# Patient Record
Sex: Female | Born: 1980 | Race: White | Hispanic: No | Marital: Married | Smoking: Former smoker
Health system: Southern US, Community
[De-identification: ages and names within clinical notes are randomized; demographics above are authoritative.]

## PROBLEM LIST (undated history)

## (undated) ENCOUNTER — Inpatient Hospital Stay: Payer: Self-pay

## (undated) DIAGNOSIS — Z8619 Personal history of other infectious and parasitic diseases: Secondary | ICD-10-CM

## (undated) DIAGNOSIS — Z87440 Personal history of urinary (tract) infections: Secondary | ICD-10-CM

## (undated) DIAGNOSIS — J301 Allergic rhinitis due to pollen: Secondary | ICD-10-CM

## (undated) HISTORY — DX: Allergic rhinitis due to pollen: J30.1

## (undated) HISTORY — DX: Personal history of other infectious and parasitic diseases: Z86.19

## (undated) HISTORY — DX: Personal history of urinary (tract) infections: Z87.440

---

## 2002-01-17 DIAGNOSIS — O119 Pre-existing hypertension with pre-eclampsia, unspecified trimester: Secondary | ICD-10-CM

## 2004-09-07 HISTORY — PX: LEEP: SHX91

## 2008-09-18 ENCOUNTER — Ambulatory Visit: Payer: Self-pay

## 2009-04-20 ENCOUNTER — Emergency Department (HOSPITAL_COMMUNITY): Admission: EM | Admit: 2009-04-20 | Discharge: 2009-04-20 | Payer: Self-pay | Admitting: Emergency Medicine

## 2010-12-14 LAB — POCT I-STAT, CHEM 8
Hemoglobin: 13.6 g/dL (ref 12.0–15.0)
Sodium: 140 mEq/L (ref 135–145)
TCO2: 23 mmol/L (ref 0–100)

## 2012-06-01 ENCOUNTER — Emergency Department: Payer: Self-pay | Admitting: *Deleted

## 2012-06-01 LAB — CBC WITH DIFFERENTIAL/PLATELET
Basophil #: 0.1 10*3/uL (ref 0.0–0.1)
HCT: 38 % (ref 35.0–47.0)
Lymphocyte #: 4.8 10*3/uL — ABNORMAL HIGH (ref 1.0–3.6)
MCH: 27.9 pg (ref 26.0–34.0)
MCV: 83 fL (ref 80–100)
Monocyte #: 0.8 x10 3/mm (ref 0.2–0.9)
Monocyte %: 5.8 %
Neutrophil #: 7 10*3/uL — ABNORMAL HIGH (ref 1.4–6.5)
Platelet: 277 10*3/uL (ref 150–440)
RDW: 13.6 % (ref 11.5–14.5)
WBC: 13 10*3/uL — ABNORMAL HIGH (ref 3.6–11.0)

## 2012-06-01 LAB — PREGNANCY, URINE: Pregnancy Test, Urine: NEGATIVE m[IU]/mL

## 2012-06-01 LAB — URINALYSIS, COMPLETE
Bilirubin,UR: NEGATIVE
Glucose,UR: NEGATIVE mg/dL (ref 0–75)
Ketone: NEGATIVE
RBC,UR: 33926 /HPF (ref 0–5)
Squamous Epithelial: NONE SEEN
WBC UR: 263 /HPF (ref 0–5)

## 2012-06-01 LAB — BASIC METABOLIC PANEL
Calcium, Total: 9.4 mg/dL (ref 8.5–10.1)
Chloride: 105 mmol/L (ref 98–107)
Co2: 23 mmol/L (ref 21–32)
EGFR (African American): >60
Potassium: 3.6 mmol/L (ref 3.5–5.1)
Sodium: 139 mmol/L (ref 136–145)

## 2013-11-16 ENCOUNTER — Ambulatory Visit: Payer: Self-pay | Admitting: Internal Medicine

## 2015-04-17 ENCOUNTER — Ambulatory Visit (INDEPENDENT_AMBULATORY_CARE_PROVIDER_SITE_OTHER): Payer: 59 | Admitting: Primary Care

## 2015-04-17 ENCOUNTER — Encounter: Payer: Self-pay | Admitting: Primary Care

## 2015-04-17 ENCOUNTER — Encounter (INDEPENDENT_AMBULATORY_CARE_PROVIDER_SITE_OTHER): Payer: Self-pay

## 2015-04-17 VITALS — BP 122/84 | HR 76 | Temp 98.2°F | Ht 65.0 in | Wt 181.0 lb

## 2015-04-17 DIAGNOSIS — F42 Obsessive-compulsive disorder: Secondary | ICD-10-CM | POA: Diagnosis not present

## 2015-04-17 DIAGNOSIS — F429 Obsessive-compulsive disorder, unspecified: Secondary | ICD-10-CM | POA: Insufficient documentation

## 2015-04-17 NOTE — Assessment & Plan Note (Signed)
Diagnosed 5-6 years ago, managed on Zoloft 50 mg by [facility]. Feels well managed on this dose. Continue same.

## 2015-04-17 NOTE — Progress Notes (Signed)
Subjective:    Patient ID: Julia Gay, female    DOB: Jun 26, 1981, 34 y.o.   MRN: [id number]  HPI  Julia Gay is a 34 year old female who presents today to establish care and discuss the problems mentioned below. Will obtain old records.  1) Obsessive compulsive disorder: Managed on Zoloft 50 mg tablets and has been taking for 5-6 years. She was once up to 200 mg and feels well managed on 50 mg.. She is managed at [facility].   2) Rectal Bleeding: One episode of rectal bleeding immediately after having a bowel movement 5 days ago. The blood was bright red and she found it on the toilet paper. Denies pain, further bleeding, itching. She has 1 bowel movement weekly which is normal for her.   Review of Systems  Constitutional: Negative for unexpected weight change.  HENT: Negative for rhinorrhea.   Respiratory: Negative for cough and shortness of breath.   Cardiovascular: Negative for chest pain.  Gastrointestinal: Negative for diarrhea and constipation.  Genitourinary: Negative for difficulty urinating.       Regular periods  Musculoskeletal: Negative for myalgias and arthralgias.  Skin: Negative for rash.  Allergic/Immunologic: Positive for environmental allergies.  Neurological: Negative for dizziness, numbness and headaches.  Psychiatric/Behavioral:       Denies concerns for anxiety or depression       Past Medical History  Diagnosis Date  . Hay fever   . History of UTI   . History of HPV infection     Social History   Social History  . Marital Status: Married    Spouse Name: N/A  . Number of Children: N/A  . Years of Education: N/A   Occupational History  . Not on file.   Social History Main Topics  . Smoking status: Former Research scientist (life sciences)  . Smokeless tobacco: Never Used     Comment: quit in 2004  . Alcohol Use: 0.0 oz/week    0 Standard drinks or equivalent per week     Comment: socially  . Drug Use: No  . Sexual Activity: Not on file   Other Topics  Concern  . Not on file   Social History Narrative   Married.    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   Has 1 child.   Enjoys playing soccer and tennis, singing, watching TV.       Past Surgical History  Procedure Laterality Date  . Leep  2006    Family History  Problem Relation Age of Onset  . Alcohol abuse Father     quit when pt was a child  . Alcohol abuse Other     both grandparents  . Heart disease Father   . Stroke Father   . Hypertension Father   . Anemia Mother   . Aplastic anemia Father     No Known Allergies  No current outpatient prescriptions on file prior to visit.   No current facility-administered medications on file prior to visit.    BP 122/84 mmHg  Pulse 76  Temp(Src) 98.2 F (36.8 C) (Oral)  Ht 5\' 5"  (1.651 m)  Wt 181 lb (82.101 kg)  BMI 30.12 kg/m2  SpO2 99%  LMP 03/28/2015    Objective:   Physical Exam  Constitutional: She appears well-nourished.  Cardiovascular: Normal rate and regular rhythm.   Pulmonary/Chest: Effort normal and breath sounds normal.  Abdominal: Soft. Bowel sounds are normal.  No hemorrhoids noted on exam. Occult stool card negative today.  Skin: Skin is warm and dry.  Psychiatric: She has a normal mood and affect.          Assessment & Plan:

## 2015-04-17 NOTE — Patient Instructions (Signed)
Please schedule a physical with me in the next month at your convienence. You will also schedule a lab only appointment one week prior. We will discuss your lab results during your physical.  We will complete your pap and breast exam during that visit as well.  It was a pleasure to meet you today! Please don't hesitate to call me with any questions. Welcome to [facility]!

## 2015-04-17 NOTE — Progress Notes (Signed)
Pre visit review using our clinic review tool, if applicable. No additional management support is needed unless otherwise documented below in the visit note. 

## 2015-04-29 ENCOUNTER — Ambulatory Visit: Payer: Self-pay | Admitting: Family Medicine

## 2015-05-07 ENCOUNTER — Other Ambulatory Visit: Payer: Self-pay | Admitting: Primary Care

## 2015-05-07 DIAGNOSIS — Z0000 Encounter for general adult medical examination without abnormal findings: Secondary | ICD-10-CM

## 2015-05-14 ENCOUNTER — Other Ambulatory Visit (INDEPENDENT_AMBULATORY_CARE_PROVIDER_SITE_OTHER): Payer: 59

## 2015-05-14 DIAGNOSIS — Z0000 Encounter for general adult medical examination without abnormal findings: Secondary | ICD-10-CM | POA: Diagnosis not present

## 2015-05-14 LAB — COMPREHENSIVE METABOLIC PANEL
ALT: 14 U/L (ref 0–35)
AST: 16 U/L (ref 0–37)
Albumin: 4.5 g/dL (ref 3.5–5.2)
Alkaline Phosphatase: 45 U/L (ref 39–117)
BILIRUBIN TOTAL: 0.4 mg/dL (ref 0.2–1.2)
BUN: 10 mg/dL (ref 6–23)
CALCIUM: 9.5 mg/dL (ref 8.4–10.5)
CHLORIDE: 102 meq/L (ref 96–112)
CO2: 26 meq/L (ref 19–32)
Creatinine, Ser: 0.6 mg/dL (ref 0.40–1.20)
GFR: 120.87 mL/min (ref >60.00)
GLUCOSE: 85 mg/dL (ref 70–99)
POTASSIUM: 4.1 meq/L (ref 3.5–5.1)
Sodium: 136 mEq/L (ref 135–145)
Total Protein: 7.1 g/dL (ref 6.0–8.3)

## 2015-05-14 LAB — LIPID PANEL
CHOL/HDL RATIO: 4
CHOLESTEROL: 168 mg/dL (ref 0–200)
HDL: 43.2 mg/dL (ref >39.00)
LDL CALC: 110 mg/dL — AB (ref 0–99)
NonHDL: 124.5
TRIGLYCERIDES: 74 mg/dL (ref 0.0–149.0)
VLDL: 14.8 mg/dL (ref 0.0–40.0)

## 2015-05-14 LAB — CBC
HEMATOCRIT: 36.6 % (ref 36.0–46.0)
HEMOGLOBIN: 11.7 g/dL — AB (ref 12.0–15.0)
MCHC: 32 g/dL (ref 30.0–36.0)
MCV: 84.8 fl (ref 78.0–100.0)
PLATELETS: 263 10*3/uL (ref 150.0–400.0)
RBC: 4.31 Mil/uL (ref 3.87–5.11)
RDW: 13.3 % (ref 11.5–15.5)
WBC: 8.7 10*3/uL (ref 4.0–10.5)

## 2015-05-14 LAB — TSH: TSH: 1.67 u[IU]/mL (ref 0.35–4.50)

## 2015-05-14 LAB — HEMOGLOBIN A1C: Hgb A1c MFr Bld: 5 % (ref 4.6–6.5)

## 2015-05-15 ENCOUNTER — Encounter: Payer: Self-pay | Admitting: Obstetrics and Gynecology

## 2015-05-15 ENCOUNTER — Other Ambulatory Visit: Payer: Self-pay | Admitting: *Deleted

## 2015-05-15 ENCOUNTER — Ambulatory Visit (INDEPENDENT_AMBULATORY_CARE_PROVIDER_SITE_OTHER): Payer: 59 | Admitting: Obstetrics and Gynecology

## 2015-05-15 ENCOUNTER — Other Ambulatory Visit: Payer: 59

## 2015-05-15 VITALS — BP 118/69 | HR 91 | Ht 64.0 in | Wt 182.7 lb

## 2015-05-15 DIAGNOSIS — N926 Irregular menstruation, unspecified: Secondary | ICD-10-CM

## 2015-05-15 LAB — POCT URINE PREGNANCY: PREG TEST UR: POSITIVE — AB

## 2015-05-15 NOTE — Progress Notes (Signed)
Patient ID: Julia Gay, female   DOB: 1980/10/28, 34 y.o.   MRN: [id number]  Here for pregnancy confimation with LMP 03/28/15, EGA [redacted]w[redacted]d and [facility] 01/02/16  Ultrasound today reveals: Indications:Dating, Viability, HX miscarriage Findings:  Singleton intrauterine pregnancy is visualized with a CRL consistent with 7 1/[redacted] weeks gestation, giving an (U/S) EDD of 12/31/2015. The (U/S) EDD is consistent with the clinically established (LMP) EDD of 01/03/2016.  FHR: 136 CRL measurement: 10.6 mm Yolk sac and and early anatomy is normal.  Right Ovary measures 3.0 x 2.1 x 2.2 cm. It is normal in appearance. Left Ovary measures 1.9 x 1.7 x 1.8 cm. It is normal appearance. There is evidence of a corpus luteal cyst in the Right Survey of the adnexa demonstrates no adnexal masses. There is no free peritoneal fluid in the cul de sac.  Impression: 1. 7 1/7 week Viable Singleton Intrauterine pregnancy by U/S. 2. (U/S) EDD is consistent with Clinically established (LMP) EDD of 01/03/2016.  Recommendations: 1.Clinical correlation with the patient's History and Physical Exam.   Leane Para, Rad Tech  Scan reviewed and agree with findings, reviewed with patient.  I:1: nausea in early pregnancy 2: IUP 7w 1d EGA 3: AMA 4: anemia- on iron and B12  P: Diclegis and PNV samples given Nurse OB intake & labs in 2 weeks and NOB PE in 4 weeks Catalino Plascencia Trudee Kuster, CNM

## 2015-05-21 ENCOUNTER — Encounter: Payer: 59 | Admitting: Primary Care

## 2015-05-21 ENCOUNTER — Other Ambulatory Visit (HOSPITAL_COMMUNITY)
Admission: RE | Admit: 2015-05-21 | Discharge: 2015-05-21 | Disposition: A | Discharge status: Home / Self Care | Payer: 59 | Source: Ambulatory Visit | Attending: Primary Care | Admitting: Primary Care

## 2015-05-21 ENCOUNTER — Encounter: Payer: Self-pay | Admitting: Primary Care

## 2015-05-21 ENCOUNTER — Ambulatory Visit (INDEPENDENT_AMBULATORY_CARE_PROVIDER_SITE_OTHER): Payer: 59 | Admitting: Primary Care

## 2015-05-21 VITALS — BP 118/72 | HR 84 | Temp 97.8°F | Ht 64.0 in | Wt 181.0 lb

## 2015-05-21 DIAGNOSIS — Z01411 Encounter for gynecological examination (general) (routine) with abnormal findings: Secondary | ICD-10-CM | POA: Insufficient documentation

## 2015-05-21 DIAGNOSIS — Z0000 Encounter for general adult medical examination without abnormal findings: Secondary | ICD-10-CM | POA: Diagnosis not present

## 2015-05-21 DIAGNOSIS — Z1151 Encounter for screening for human papillomavirus (HPV): Secondary | ICD-10-CM | POA: Diagnosis not present

## 2015-05-21 NOTE — Assessment & Plan Note (Signed)
Tetanus UTD. Pap preformed today. She is [redacted] weeks pregnant and is following with [facility]. Discussed the importance of healthy diet and exercise to maintain a healthy weight. She will work to reduce fast food, fried foods and sugar. Labs unremarkable, exam unremarkable. Follow up in 1 year for repeat physical.

## 2015-05-21 NOTE — Progress Notes (Signed)
Pre visit review using our clinic review tool, if applicable. No additional management support is needed unless otherwise documented below in the visit note. 

## 2015-05-21 NOTE — Patient Instructions (Addendum)
We will notify you of your pap results.  Work to [employer] by limiting fast food, fried foods, and sugar. Start exercising regularly as allowed by OB/GYN.  Follow up in 1 year for repeat physical or sooner if needed.  It was a pleasure to see you today!

## 2015-05-21 NOTE — Progress Notes (Signed)
Subjective:    Patient ID: Julia Gay, female    DOB: Feb 21, 1981, 34 y.o.   MRN: [id number]  HPI  Julia Gay is a 34 year old female who presents today for complete physical.  Immunizations: -Tetanus: Completed within 10 years.  -Influenza: Does not complete.    Diet: Endorses a poor diet. She is a vegetarian. Breakfast: Fast food.  Lunch: Fast food.  Dinner: Pasta, salads, vegetables. Beverages: Water, sodas Desserts: Cookies, cupcakes. Once twice weekly.  Exercise: She is not exercising, but is active with work.  Eye exam: Completed 2 years ago. Denies changes in vision. Dental exam: Completed cleaning several months ago. Pap Smear: Completed 1.5 years ago. Would like one today.    Review of Systems  Constitutional: Negative for unexpected weight change.  HENT: Negative for rhinorrhea.   Respiratory: Negative for cough and shortness of breath.   Cardiovascular: Negative for chest pain.  Gastrointestinal: Negative for diarrhea and constipation.  Genitourinary: Negative for difficulty urinating.       [redacted] weeks pregnant  Musculoskeletal: Negative for myalgias and arthralgias.  Skin: Negative for rash.  Neurological: Negative for dizziness, numbness and headaches.  Psychiatric/Behavioral:       Denies concerns for anxiety or depression.       Past Medical History  Diagnosis Date  . Hay fever   . History of UTI   . History of HPV infection     Social History   Social History  . Marital Status: Married    Spouse Name: N/A  . Number of Children: N/A  . Years of Education: N/A   Occupational History  . Not on file.   Social History Main Topics  . Smoking status: Former Research scientist (life sciences)  . Smokeless tobacco: Never Used     Comment: quit in 2004  . Alcohol Use: 0.0 oz/week    0 Standard drinks or equivalent per week     Comment: socially  . Drug Use: No  . Sexual Activity: Yes    Birth Control/ Protection: None   Other Topics Concern  . Not on file   Social  History Narrative   Married.    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   Has 1 child.   Enjoys playing soccer and tennis, singing, watching TV.       Past Surgical History  Procedure Laterality Date  . Leep  2006    Family History  Problem Relation Age of Onset  . Alcohol abuse Father     quit when pt was a child  . Alcohol abuse Other     both grandparents  . Heart disease Father   . Stroke Father   . Hypertension Father   . Anemia Mother   . Aplastic anemia Father     No Known Allergies  Current Outpatient Prescriptions on File Prior to Visit  Medication Sig Dispense Refill  . metroNIDAZOLE (METROCREAM) 0.75 % cream Apply topically 2 (two) times daily.    . sertraline (ZOLOFT) 50 MG tablet Take 1 tablet by mouth daily.     No current facility-administered medications on file prior to visit.    BP 118/72 mmHg  Pulse 84  Temp(Src) 97.8 F (36.6 C) (Oral)  Ht 5\' 4"  (1.626 m)  Wt 181 lb (82.101 kg)  BMI 31.05 kg/m2  SpO2 98%  LMP 03/29/2015    Objective:   Physical Exam  Constitutional: She is oriented to person, place, and time. She appears well-nourished.  HENT:  Right Ear: Tympanic membrane and ear canal normal.  Left Ear: Tympanic membrane and ear canal normal.  Nose: Nose normal.  Mouth/Throat: Oropharynx is clear and moist.  Eyes: Conjunctivae and EOM are normal. Pupils are equal, round, and reactive to light.  Neck: Neck supple. No thyromegaly present.  Cardiovascular: Normal rate and regular rhythm.   Pulmonary/Chest: Effort normal and breath sounds normal. Right breast exhibits no mass and no tenderness. Left breast exhibits no mass and no tenderness.  Abdominal: Soft. Bowel sounds are normal. There is no tenderness.  Genitourinary: Vagina normal. No breast swelling or tenderness. Cervix exhibits no motion tenderness and no discharge. Right adnexum displays no tenderness. Left adnexum displays no tenderness.  Musculoskeletal: Normal  range of motion.  Lymphadenopathy:    She has no cervical adenopathy.  Neurological: She is alert and oriented to person, place, and time. She has normal reflexes. No cranial nerve deficit.  Skin: Skin is warm and dry.  Psychiatric: She has a normal mood and affect.          Assessment & Plan:

## 2015-05-23 LAB — CYTOLOGY - PAP

## 2015-05-30 ENCOUNTER — Ambulatory Visit (INDEPENDENT_AMBULATORY_CARE_PROVIDER_SITE_OTHER): Payer: 59 | Admitting: Obstetrics and Gynecology

## 2015-05-30 VITALS — BP 111/76 | HR 80 | Wt 184.0 lb

## 2015-05-30 DIAGNOSIS — R638 Other symptoms and signs concerning food and fluid intake: Secondary | ICD-10-CM

## 2015-05-30 DIAGNOSIS — Z1389 Encounter for screening for other disorder: Secondary | ICD-10-CM

## 2015-05-30 DIAGNOSIS — T7589XA Other specified effects of external causes, initial encounter: Secondary | ICD-10-CM

## 2015-05-30 DIAGNOSIS — Z331 Pregnant state, incidental: Secondary | ICD-10-CM

## 2015-05-30 DIAGNOSIS — Z113 Encounter for screening for infections with a predominantly sexual mode of transmission: Secondary | ICD-10-CM

## 2015-05-30 NOTE — Patient Instructions (Signed)

## 2015-05-30 NOTE — Progress Notes (Signed)
ZIKA EXPOSURE SCREEN:  The patient has not traveled to a [place] Virus endemic area within the past 6 months, nor has she had unprotected sex with a partner who has travelled to a [place] endemic region within the past 6 months. The patient has been advised to notify us if these factors change any time during this current pregnancy, so adequate testing and monitoring can be initiated.  Julia Gay for NOB nurse interview visit. G-3.   P-1011. Pregnancy eduction material explained and given. No cats in the home. 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 She does not clean litter box but does sweep litter off floor. Pt would like to have this done. NOB labs ordered. TSH/HbgA1c done on 05/14/2015 and was not repeated. HIV labs and Drug screen were explained optional and she could opt out of tests but did not decline. Drug screen ordered  and sent to lab. PNV encouraged. NT to discuss with provider. Pt. To follow up with provider in 1-2 weeks for NOB physical.  All questions answered.

## 2015-05-31 ENCOUNTER — Other Ambulatory Visit: Payer: Self-pay | Admitting: Obstetrics and Gynecology

## 2015-05-31 DIAGNOSIS — O09899 Supervision of other high risk pregnancies, unspecified trimester: Secondary | ICD-10-CM | POA: Insufficient documentation

## 2015-05-31 DIAGNOSIS — O9989 Other specified diseases and conditions complicating pregnancy, childbirth and the puerperium: Principal | ICD-10-CM

## 2015-05-31 DIAGNOSIS — Z2839 Other underimmunization status: Secondary | ICD-10-CM

## 2015-05-31 DIAGNOSIS — Z283 Underimmunization status: Secondary | ICD-10-CM | POA: Insufficient documentation

## 2015-05-31 LAB — CBC WITH DIFFERENTIAL/PLATELET
BASOS: 0 %
Basophils Absolute: 0 10*3/uL (ref 0.0–0.2)
EOS (ABSOLUTE): 0.1 10*3/uL (ref 0.0–0.4)
EOS: 2 %
HEMATOCRIT: 36.5 % (ref 34.0–46.6)
Hemoglobin: 11.9 g/dL (ref 11.1–15.9)
IMMATURE GRANS (ABS): 0 10*3/uL (ref 0.0–0.1)
IMMATURE GRANULOCYTES: 0 %
LYMPHS: 23 %
Lymphocytes Absolute: 1.9 10*3/uL (ref 0.7–3.1)
MCH: 27.4 pg (ref 26.6–33.0)
MCHC: 32.6 g/dL (ref 31.5–35.7)
MCV: 84 fL (ref 79–97)
MONOS ABS: 0.5 10*3/uL (ref 0.1–0.9)
Monocytes: 6 %
NEUTROS PCT: 69 %
Neutrophils Absolute: 5.6 10*3/uL (ref 1.4–7.0)
PLATELETS: 295 10*3/uL (ref 150–379)
RBC: 4.34 x10E6/uL (ref 3.77–5.28)
RDW: 13.2 % (ref 12.3–15.4)
WBC: 8.1 10*3/uL (ref 3.4–10.8)

## 2015-05-31 LAB — TOXOPLASMA ANTIBODIES- IGG AND  IGM
Toxoplasma Antibody- IgM: 7.3 AU/mL (ref 0.0–7.9)
Toxoplasma IgG Ratio: <3 IU/mL (ref 0.0–7.1)

## 2015-05-31 LAB — URINALYSIS, ROUTINE W REFLEX MICROSCOPIC
BILIRUBIN UA: NEGATIVE
Glucose, UA: NEGATIVE
Ketones, UA: NEGATIVE
Leukocytes, UA: NEGATIVE
Nitrite, UA: NEGATIVE
PH UA: 6.5 (ref 5.0–7.5)
Protein, UA: NEGATIVE
RBC UA: NEGATIVE
Specific Gravity, UA: 1.02 (ref 1.005–1.030)
UUROB: 0.2 mg/dL (ref 0.2–1.0)

## 2015-05-31 LAB — HIV ANTIBODY (ROUTINE TESTING W REFLEX): HIV SCREEN 4TH GENERATION: NONREACTIVE

## 2015-05-31 LAB — VARICELLA ZOSTER ANTIBODY, IGM

## 2015-05-31 LAB — RH TYPE: RH TYPE: POSITIVE

## 2015-05-31 LAB — ABO

## 2015-05-31 LAB — RUBELLA ANTIBODY, IGM: Rubella IgM: <20 AU/mL (ref 0.0–19.9)

## 2015-05-31 LAB — HEPATITIS B SURFACE ANTIGEN: Hepatitis B Surface Ag: NEGATIVE

## 2015-05-31 LAB — ANTIBODY SCREEN: Antibody Screen: NEGATIVE

## 2015-05-31 LAB — RPR: RPR: NONREACTIVE

## 2015-06-01 LAB — GC/CHLAMYDIA PROBE AMP
CHLAMYDIA, DNA PROBE: NEGATIVE
Neisseria gonorrhoeae by PCR: NEGATIVE

## 2015-06-01 LAB — URINE CULTURE

## 2015-06-10 ENCOUNTER — Encounter: Payer: Self-pay | Admitting: Obstetrics and Gynecology

## 2015-06-12 ENCOUNTER — Encounter: Payer: 59 | Admitting: Obstetrics and Gynecology

## 2015-06-13 ENCOUNTER — Ambulatory Visit (INDEPENDENT_AMBULATORY_CARE_PROVIDER_SITE_OTHER): Payer: 59 | Admitting: Obstetrics and Gynecology

## 2015-06-13 ENCOUNTER — Encounter: Payer: Self-pay | Admitting: Obstetrics and Gynecology

## 2015-06-13 VITALS — BP 110/68 | HR 80 | Wt 183.5 lb

## 2015-06-13 DIAGNOSIS — O09521 Supervision of elderly multigravida, first trimester: Secondary | ICD-10-CM

## 2015-06-13 DIAGNOSIS — Z3491 Encounter for supervision of normal pregnancy, unspecified, first trimester: Secondary | ICD-10-CM

## 2015-06-13 DIAGNOSIS — E669 Obesity, unspecified: Secondary | ICD-10-CM

## 2015-06-13 DIAGNOSIS — O99210 Obesity complicating pregnancy, unspecified trimester: Secondary | ICD-10-CM | POA: Insufficient documentation

## 2015-06-13 LAB — POCT URINALYSIS DIPSTICK
BILIRUBIN UA: NEGATIVE
Blood, UA: NEGATIVE
GLUCOSE UA: NEGATIVE
KETONES UA: NEGATIVE
LEUKOCYTES UA: NEGATIVE
NITRITE UA: NEGATIVE
Protein, UA: NEGATIVE
Spec Grav, UA: 1.01
Urobilinogen, UA: 0.2
pH, UA: 7

## 2015-06-13 NOTE — Progress Notes (Signed)
NOB physical, pt is feeling well

## 2015-06-13 NOTE — Patient Instructions (Signed)

## 2015-06-13 NOTE — Progress Notes (Signed)
NEW OB HISTORY AND PHYSICAL  SUBJECTIVE:       Julia Gay is a 34 y.o. G70P1010 female, Patient's last menstrual period was 03/29/2015 (exact date)., Estimated Date of Delivery: 01/03/16, [redacted]w[redacted]d, presents today for establishment of Prenatal Care. She has no unusual complaints and complains of improved nausea      Gynecologic History Patient's last menstrual period was 03/29/2015 (exact date). Normal Contraception: none Last Pap: 05/2015. Results were: normal  Obstetric History OB History  Gravida Para Term Preterm AB SAB TAB Ectopic Multiple Living  3 1 1  1 1         # Outcome Date GA Lbr Len/2nd Weight Sex Delivery Anes PTL Lv  3 Current           2 SAB 12/2014        FD  1 Term 01/17/02 [redacted]w[redacted]d  7 lb 6 oz (3.345 kg) M Vag-Spont  N      Complications: Preeclampsia complicating hypertension      Past Medical History  Diagnosis Date  . Hay fever   . History of UTI   . History of HPV infection     Past Surgical History  Procedure Laterality Date  . Leep  2006    Current Outpatient Prescriptions on File Prior to Visit  Medication Sig Dispense Refill  . ferrous fumarate (HEMOCYTE - 106 MG FE) 325 (106 FE) MG TABS tablet Take 1 tablet by mouth daily.    . metroNIDAZOLE (METROCREAM) 0.75 % cream Apply topically 2 (two) times daily.    . Prenatal Vit-Fe Fumarate-FA (PRENATAL MULTIVITAMIN) TABS tablet Take 1 tablet by mouth daily at 12 noon.    . sertraline (ZOLOFT) 50 MG tablet Take 1 tablet by mouth daily.    . vitamin B-12 (CYANOCOBALAMIN) 100 MCG tablet Take 100 mcg by mouth daily.     No current facility-administered medications on file prior to visit.    No Known Allergies  Social History   Social History  . Marital Status: Married    Spouse Name: N/A  . Number of Children: N/A  . Years of Education: N/A   Occupational History  . Not on file.   Social History Main Topics  . Smoking status: Former Research scientist (life sciences)  . Smokeless tobacco: Never Used     Comment: quit  in 2004  . Alcohol Use: 0.0 oz/week    0 Standard drinks or equivalent per week     Comment: socially  . Drug Use: No  . Sexual Activity: Yes    Birth Control/ Protection: None   Other Topics Concern  . Not on file   Social History Narrative   Married.    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   Has 1 child.   Enjoys playing soccer and tennis, singing, watching TV.       Family History  Problem Relation Age of Onset  . Alcohol abuse Father     quit when pt was a child  . Alcohol abuse Other     both grandparents  . Heart disease Father   . Stroke Father   . Hypertension Father   . Anemia Mother   . Aplastic anemia Father     The following portions of the patient's history were reviewed and updated as appropriate: allergies, current medications, past OB history, past medical history, past surgical history, past family history, past social history, and problem list.    OBJECTIVE: Initial Physical Exam (New OB)  GENERAL APPEARANCE: alert,  well appearing, in no apparent distress, oriented to person, place and time, overweight, well hydrated HEAD: normocephalic, atraumatic MOUTH: mucous membranes moist, pharynx normal without lesions THYROID: no thyromegaly or masses present BREASTS: no masses noted, no significant tenderness, no palpable axillary nodes, no skin changes LUNGS: clear to auscultation, no wheezes, rales or rhonchi, symmetric air entry HEART: regular rate and rhythm, no murmurs ABDOMEN: soft, nontender, nondistended, no abnormal masses, no epigastric pain, obese, fundus not palpable and FHT present EXTREMITIES: no redness or tenderness in the calves or thighs, no edema SKIN: normal coloration and turgor, no rashes LYMPH NODES: no adenopathy palpable NEUROLOGIC: alert, oriented, normal speech, no focal findings or movement disorder noted  PELVIC EXAM normal   ASSESSMENT: Normal pregnancy, BMI 30, AMA  PLAN:  [facility]  obtained Prenatal care See orders

## 2015-06-20 ENCOUNTER — Encounter: Payer: Self-pay | Admitting: Obstetrics and Gynecology

## 2015-06-20 ENCOUNTER — Other Ambulatory Visit: Payer: Self-pay | Admitting: Obstetrics and Gynecology

## 2015-06-27 ENCOUNTER — Encounter: Payer: Self-pay | Admitting: Obstetrics and Gynecology

## 2015-07-02 ENCOUNTER — Encounter: Payer: Self-pay | Admitting: Internal Medicine

## 2015-07-02 ENCOUNTER — Ambulatory Visit (INDEPENDENT_AMBULATORY_CARE_PROVIDER_SITE_OTHER): Payer: 59 | Admitting: Internal Medicine

## 2015-07-02 VITALS — BP 110/66 | HR 90 | Temp 97.9°F | Wt 183.0 lb

## 2015-07-02 DIAGNOSIS — R002 Palpitations: Secondary | ICD-10-CM

## 2015-07-02 DIAGNOSIS — N390 Urinary tract infection, site not specified: Secondary | ICD-10-CM | POA: Diagnosis not present

## 2015-07-02 LAB — POCT URINALYSIS DIPSTICK
BILIRUBIN UA: NEGATIVE
GLUCOSE UA: NEGATIVE
KETONES UA: NEGATIVE
Nitrite, UA: NEGATIVE
PH UA: 5.5
RBC UA: NEGATIVE
Spec Grav, UA: >=1.03
Urobilinogen, UA: NEGATIVE

## 2015-07-02 MED ORDER — AMOXICILLIN-POT CLAVULANATE 875-125 MG PO TABS: 1.0000 | ORAL_TABLET | Freq: Two times a day (BID) | ORAL | Status: DC

## 2015-07-02 NOTE — Progress Notes (Signed)
HPI  Pt presents to the clinic today with c/o plapitations. She reports she awoke this afternoon and felt the palpitations. She denies chest pain, chest tightness or dizziness. She did not consume any caffeine or other stimulants prior or after her nap. She reports she has not taken her Zoloft 2 days prior, but she did take it this morning. Last time she had these symptoms she had a UTI. She denies dysuria, urgency, frequency or blood in her urine. She denies fever, chills or body aches. She has not taken anything OTC.   Review of Systems  Past Medical History  Diagnosis Date  . Hay fever   . History of UTI   . History of HPV infection     Family History  Problem Relation Age of Onset  . Alcohol abuse Father     quit when pt was a child  . Alcohol abuse Other     both grandparents  . Heart disease Father   . Stroke Father   . Hypertension Father   . Anemia Mother   . Aplastic anemia Father     Social History   Social History  . Marital Status: Married    Spouse Name: N/A  . Number of Children: N/A  . Years of Education: N/A   Occupational History  . Not on file.   Social History Main Topics  . Smoking status: Former Research scientist (life sciences)  . Smokeless tobacco: Never Used     Comment: quit in 2004  . Alcohol Use: 0.0 oz/week    0 Standard drinks or equivalent per week     Comment: socially  . Drug Use: No  . Sexual Activity: Yes    Birth Control/ Protection: None   Other Topics Concern  . Not on file   Social History Narrative   Married.    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   Has 1 child.   Enjoys playing soccer and tennis, singing, watching TV.       No Known Allergies  Constitutional: Denies fever, malaise, fatigue, headache or abrupt weight changes.   Cardiovascular: Pt reports palpitations. Denies chest pain, chest tightness. GU: Denies urgency, frequency, dysuria, burning sensation, blood in urine, odor or discharge. Skin: Denies redness, rashes,  lesions or ulcercations.   No other specific complaints in a complete review of systems (except as listed in HPI above).    Objective:   Physical Exam  BP 110/66 mmHg  Pulse 90  Temp(Src) 97.9 F (36.6 C) (Oral)  Wt 183 lb (83.008 kg)  SpO2 99%  LMP 03/29/2015 (Exact Date)  Wt Readings from Last 3 Encounters:  07/02/15 183 lb (83.008 kg)  06/13/15 183 lb 8 oz (83.235 kg)  05/30/15 184 lb (83.462 kg)    General: Appears her stated age, well developed, well nourished in NAD. Cardiovascular: Normal rate and rhythm. S1,S2 noted. No murmur noted.   Pulmonary/Chest: Normal effort and positive vesicular breath sounds. No respiratory distress. No wheezes, rales or ronchi noted.  Abdomen: Soft and nontender. Normal bowel sounds. No distention or masses noted.  No CVA tenderness.      Assessment & Plan:   Palpitations   Urinalysis: 2+ leuks Will send urine culture eRx sent if for augmentin BID x 7 days (she is pregnant) Drink plenty of fluids She has had a multitude of labs recently- no need to repeat at this time  RTC as needed or if symptoms persist.

## 2015-07-02 NOTE — Progress Notes (Signed)
Pre visit review using our clinic review tool, if applicable. No additional management support is needed unless otherwise documented below in the visit note. 

## 2015-07-03 NOTE — Patient Instructions (Signed)

## 2015-07-04 ENCOUNTER — Encounter: Payer: Self-pay | Admitting: Primary Care

## 2015-07-04 ENCOUNTER — Ambulatory Visit (INDEPENDENT_AMBULATORY_CARE_PROVIDER_SITE_OTHER): Payer: 59 | Admitting: Primary Care

## 2015-07-04 VITALS — BP 110/68 | HR 81 | Temp 98.1°F | Ht 64.0 in | Wt 185.0 lb

## 2015-07-04 DIAGNOSIS — R002 Palpitations: Secondary | ICD-10-CM

## 2015-07-04 LAB — URINE CULTURE
COLONY COUNT: NO GROWTH
Organism ID, Bacteria: NO GROWTH

## 2015-07-04 NOTE — Assessment & Plan Note (Signed)
Present since Tuesday intermittently 1-2 times. ECG with NSR and no abnormality which is reasuring. Normal TSH and CBC in September. Suspect this is anxiety related or due to changes in blood stream from pregnancy. She does take Zoloft irregularly, discussed importance of daily intake. Return precautions provided

## 2015-07-04 NOTE — Progress Notes (Signed)
Subjective:    Patient ID: Julia Gay, female    DOB: 04-13-1981, 34 y.o.   MRN: [id number]  HPI  [name] is a 34 year old female who presents today with a chief complaint of palpitations.    She woke up Tuesday afternoon from a nap and noticed palpitations, shortness of breath, and a "shakey" feeloing. She sat there for a few seconds and her palpitations subsided. Her palpitations returned shortly after getting into her car to pick up her son from school. She has had this same incidence occur 2 years ago and had a urinary tract infection. She was evaluated on 10/25 for palpitations and diagnosed with a UTI for 2+ leuks. She was initiated on Augmentin BID for 7 days. Her urine culture came back without growth. She is managed on Zoloft 50 mg daily for OCD and anxiety. She does endorse not taking her Zoloft regularly as she will miss a day every now and then. She will develop palpitations when she misses doses of her Zoloft but typically more so at night.   She is currently 3.5 months pregnant. She reports pelvic pressure and urinary frequency. Denies vaginal discharge or itching.   Review of Systems  Constitutional: Positive for chills. Negative for fever.  Respiratory: Positive for shortness of breath.   Cardiovascular: Positive for palpitations.  Gastrointestinal: Positive for nausea. Negative for vomiting.       Past Medical History  Diagnosis Date  . Hay fever   . History of UTI   . History of HPV infection     Social History   Social History  . Marital Status: Married    Spouse Name: N/A  . Number of Children: N/A  . Years of Education: N/A   Occupational History  . Not on file.   Social History Main Topics  . Smoking status: Former Research scientist (life sciences)  . Smokeless tobacco: Never Used     Comment: quit in 2004  . Alcohol Use: 0.0 oz/week    0 Standard drinks or equivalent per week     Comment: socially  . Drug Use: No  . Sexual Activity: Yes    Birth Control/ Protection:  None   Other Topics Concern  . Not on file   Social History Narrative   Married.    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   Has 1 child.   Enjoys playing soccer and tennis, singing, watching TV.       Past Surgical History  Procedure Laterality Date  . Leep  2006    Family History  Problem Relation Age of Onset  . Alcohol abuse Father     quit when pt was a child  . Alcohol abuse Other     both grandparents  . Heart disease Father   . Stroke Father   . Hypertension Father   . Anemia Mother   . Aplastic anemia Father     No Known Allergies  Current Outpatient Prescriptions on File Prior to Visit  Medication Sig Dispense Refill  . amoxicillin-clavulanate (AUGMENTIN) 875-125 MG tablet Take 1 tablet by mouth 2 (two) times daily. 20 tablet 0  . ferrous fumarate (HEMOCYTE - 106 MG FE) 325 (106 FE) MG TABS tablet Take 1 tablet by mouth daily.    . metroNIDAZOLE (METROCREAM) 0.75 % cream Apply topically 2 (two) times daily.    . Prenatal Vit-Fe Fumarate-FA (PRENATAL MULTIVITAMIN) TABS tablet Take 1 tablet by mouth daily at 12 noon.    Marland Kitchen  sertraline (ZOLOFT) 50 MG tablet Take 1 tablet by mouth daily.    . vitamin B-12 (CYANOCOBALAMIN) 100 MCG tablet Take 100 mcg by mouth daily.     No current facility-administered medications on file prior to visit.    BP 110/68 mmHg  Pulse 81  Temp(Src) 98.1 F (36.7 C) (Oral)  Ht 5\' 4"  (1.626 m)  Wt 185 lb (83.915 kg)  BMI 31.74 kg/m2  SpO2 98%  LMP 03/29/2015 (Exact Date)    Objective:   Physical Exam  Constitutional: She appears well-nourished.  Cardiovascular: Normal rate and regular rhythm.   No murmur heard. Pulmonary/Chest: Effort normal and breath sounds normal.  Abdominal: Soft. Bowel sounds are normal. There is no tenderness.  Skin: Skin is warm and dry.  Psychiatric: She has a normal mood and affect.          Assessment & Plan:

## 2015-07-04 NOTE — Patient Instructions (Signed)
Your ECG looks good with is very reassuring.   Your palpitations are likely due to anxiety, but I do not want to increase your Zoloft due to your pregnancy.  Please notify me if your palpitations start occuring more frequently or everyday.  Palpitations A palpitation is the feeling that your heartbeat is irregular or is faster than normal. It may feel like your heart is fluttering or skipping a beat. Palpitations are usually not a serious problem. However, in some cases, you may need further medical evaluation. CAUSES  Palpitations can be caused by:  Smoking.  Caffeine or other stimulants, such as diet pills or energy drinks.  Alcohol.  Stress and anxiety.  Strenuous physical activity.  Fatigue.  Certain medicines.  Heart disease, especially if you have a history of irregular heart rhythms (arrhythmias), such as atrial fibrillation, atrial flutter, or supraventricular tachycardia.  An improperly working pacemaker or defibrillator. DIAGNOSIS  To find the cause of your palpitations, your health care provider will take your medical history and perform a physical exam. Your health care provider may also have you take a test called an ambulatory electrocardiogram (ECG). An ECG records your heartbeat patterns over a 24-hour period. You may also have other tests, such as:  Transthoracic echocardiogram (TTE). During echocardiography, sound waves are used to evaluate how blood flows through your heart.  Transesophageal echocardiogram (TEE).  Cardiac monitoring. This allows your health care provider to monitor your heart rate and rhythm in real time.  Holter monitor. This is a portable device that records your heartbeat and can help diagnose heart arrhythmias. It allows your health care provider to track your heart activity for several days, if needed.  Stress tests by exercise or by giving medicine that makes the heart beat faster. TREATMENT  Treatment of palpitations depends on the  cause of your symptoms and can vary greatly. Most cases of palpitations do not require any treatment other than time, relaxation, and monitoring your symptoms. Other causes, such as atrial fibrillation, atrial flutter, or supraventricular tachycardia, usually require further treatment. HOME CARE INSTRUCTIONS   Avoid:  Caffeinated coffee, tea, soft drinks, diet pills, and energy drinks.  Chocolate.  Alcohol.  Stop smoking if you smoke.  Reduce your stress and anxiety. Things that can help you relax include:  A method of controlling things in your body, such as your heartbeats, with your mind (biofeedback).  Yoga.  Meditation.  Physical activity such as swimming, jogging, or walking.  Get plenty of rest and sleep. SEEK MEDICAL CARE IF:   You continue to have a fast or irregular heartbeat beyond 24 hours.  Your palpitations occur more often. SEEK IMMEDIATE MEDICAL CARE IF:  You have chest pain or shortness of breath.  You have a severe headache.  You feel dizzy or you faint. MAKE SURE YOU:  Understand these instructions.  Will watch your condition.  Will get help right away if you are not doing well or get worse.   This information is not intended to replace advice given to you by your health care provider. Make sure you discuss any questions you have with your health care provider.   Document Released: 08/21/2000 Document Revised: 08/29/2013 Document Reviewed: 10/23/2011 Elsevier Interactive Patient Education [employer].

## 2015-07-04 NOTE — Progress Notes (Signed)
Pre visit review using our clinic review tool, if applicable. No additional management support is needed unless otherwise documented below in the visit note. 

## 2015-07-10 ENCOUNTER — Telehealth: Payer: Self-pay | Admitting: Primary Care

## 2015-07-10 NOTE — Telephone Encounter (Signed)
Pt called wanting to know what she can take over the counter for a cold  She is 14 weeks pregrant Please advise

## 2015-07-10 NOTE — Telephone Encounter (Signed)
Please notify Julia Gay that she may take:  Cough and chest congestion: Robitussin DM. May take Nyquil if having difficulty sleeping with cough. Body aches and fevers: Tylenol Nasal congestion: Saline nasal spray  I hope this helps! Please have her see me if her symptoms are still bothersome for more that 10 days after they began.

## 2015-07-11 NOTE — Telephone Encounter (Signed)
Called and notified patient of Kate's comments. Patient verbalized understanding.  

## 2015-07-16 ENCOUNTER — Encounter: Payer: Self-pay | Admitting: Obstetrics and Gynecology

## 2015-07-16 ENCOUNTER — Ambulatory Visit (INDEPENDENT_AMBULATORY_CARE_PROVIDER_SITE_OTHER): Payer: 59 | Admitting: Obstetrics and Gynecology

## 2015-07-16 VITALS — BP 112/71 | HR 99 | Wt 186.7 lb

## 2015-07-16 DIAGNOSIS — Z3492 Encounter for supervision of normal pregnancy, unspecified, second trimester: Secondary | ICD-10-CM | POA: Diagnosis not present

## 2015-07-16 LAB — POCT URINALYSIS DIPSTICK
Bilirubin, UA: NEGATIVE
Blood, UA: NEGATIVE
GLUCOSE UA: NEGATIVE
KETONES UA: NEGATIVE
Nitrite, UA: NEGATIVE
Spec Grav, UA: 1.02
Urobilinogen, UA: 0.2
pH, UA: 6

## 2015-07-16 MED ORDER — INFLUENZA VAC SPLIT QUAD 0.5 ML IM SUSY
0.5000 mL | PREFILLED_SYRINGE | Freq: Once | INTRAMUSCULAR | Status: AC
  Administered 2015-07-16: 0.5 mL via INTRAMUSCULAR

## 2015-07-16 NOTE — Addendum Note (Signed)
Addended by: Keturah Barre L on: 07/16/2015 04:58 PM   Modules accepted: Orders

## 2015-07-16 NOTE — Progress Notes (Signed)
ROB- ok to take sudafed, anatomy scan next visit.

## 2015-07-16 NOTE — Progress Notes (Signed)
ROB-pt is having some nasal congestion, she is having slight congestion, otherwise she is doing well

## 2015-07-16 NOTE — Patient Instructions (Signed)
Influenza Virus Vaccine injection What is this medicine? INFLUENZA VIRUS VACCINE (in floo EN zuh VAHY ruhs vak SEEN) helps to reduce the risk of getting influenza also known as the flu. The vaccine only helps protect you against some strains of the flu. This medicine may be used for other purposes; ask your health care provider or pharmacist if you have questions. What should I tell my health care provider before I take this medicine? They need to know if you have any of these conditions: -bleeding disorder like hemophilia -fever or infection -Guillain-Barre syndrome or other neurological problems -immune system problems -infection with the human immunodeficiency virus (HIV) or AIDS -low blood platelet counts -multiple sclerosis -an unusual or allergic reaction to influenza virus vaccine, latex, other medicines, foods, dyes, or preservatives. Different brands of vaccines contain different allergens. Some may contain latex or eggs. Talk to your doctor about your allergies to make sure that you get the right vaccine. -pregnant or trying to get pregnant -breast-feeding How should I use this medicine? This vaccine is for injection into a muscle or under the skin. It is given by a health care professional. A copy of Vaccine Information Statements will be given before each vaccination. Read this sheet carefully each time. The sheet may change frequently. Talk to your healthcare provider to see which vaccines are right for you. Some vaccines should not be used in all age groups. Overdosage: If you think you have taken too much of this medicine contact a poison control center or emergency room at once. NOTE: This medicine is only for you. Do not share this medicine with others. What if I miss a dose? This does not apply. What may interact with this medicine? -chemotherapy or radiation therapy -medicines that lower your immune system like etanercept, anakinra, infliximab, and adalimumab -medicines  that treat or prevent blood clots like warfarin -phenytoin -steroid medicines like prednisone or cortisone -theophylline -vaccines This list may not describe all possible interactions. Give your health care provider a list of all the medicines, herbs, non-prescription drugs, or dietary supplements you use. Also tell them if you smoke, drink alcohol, or use illegal drugs. Some items may interact with your medicine. What should I watch for while using this medicine? Report any side effects that do not go away within 3 days to your doctor or health care professional. Call your health care provider if any unusual symptoms occur within 6 weeks of receiving this vaccine. You may still catch the flu, but the illness is not usually as bad. You cannot get the flu from the vaccine. The vaccine will not protect against colds or other illnesses that may cause fever. The vaccine is needed every year. What side effects may I notice from receiving this medicine? Side effects that you should report to your doctor or health care professional as soon as possible: -allergic reactions like skin rash, itching or hives, swelling of the face, lips, or tongue Side effects that usually do not require medical attention (report to your doctor or health care professional if they continue or are bothersome): -fever -headache -muscle aches and pains -pain, tenderness, redness, or swelling at the injection site -tiredness This list may not describe all possible side effects. Call your doctor for medical advice about side effects. You may report side effects to FDA at 1-800-FDA-1088. Where should I keep my medicine? The vaccine will be given by a health care professional in a clinic, pharmacy, doctor's office, or other health care setting. You will not   be given vaccine doses to store at home. NOTE: This sheet is a summary. It may not cover all possible information. If you have questions about this medicine, talk to your  doctor, pharmacist, or health care provider.    2016, Elsevier/Gold Standard. (2015-03-15 10:07:28)   Second Trimester of Pregnancy The second trimester is from week 13 through week 28, months 4 through 6. The second trimester is often a time when you feel your best. Your body has also adjusted to being pregnant, and you begin to feel better physically. Usually, morning sickness has lessened or quit completely, you may have more energy, and you may have an increase in appetite. The second trimester is also a time when the fetus is growing rapidly. At the end of the sixth month, the fetus is about 9 inches long and weighs about 1 pounds. You will likely begin to feel the baby move (quickening) between 18 and 20 weeks of the pregnancy. BODY CHANGES Your body goes through many changes during pregnancy. The changes vary from woman to woman.   Your weight will continue to increase. You will notice your lower abdomen bulging out.  You may begin to get stretch marks on your hips, abdomen, and breasts.  You may develop headaches that can be relieved by medicines approved by your health care provider.  You may urinate more often because the fetus is pressing on your bladder.  You may develop or continue to have heartburn as a result of your pregnancy.  You may develop constipation because certain hormones are causing the muscles that push waste through your intestines to slow down.  You may develop hemorrhoids or swollen, bulging veins (varicose veins).  You may have back pain because of the weight gain and pregnancy hormones relaxing your joints between the bones in your pelvis and as a result of a shift in weight and the muscles that support your balance.  Your breasts will continue to grow and be tender.  Your gums may bleed and may be sensitive to brushing and flossing.  Dark spots or blotches (chloasma, mask of pregnancy) may develop on your face. This will likely fade after the baby is  born.  A dark line from your belly button to the pubic area (linea nigra) may appear. This will likely fade after the baby is born.  You may have changes in your hair. These can include thickening of your hair, rapid growth, and changes in texture. Some women also have hair loss during or after pregnancy, or hair that feels dry or thin. Your hair will most likely return to normal after your baby is born. WHAT TO EXPECT AT YOUR PRENATAL VISITS During a routine prenatal visit:  You will be weighed to make sure you and the fetus are growing normally.  Your blood pressure will be taken.  Your abdomen will be measured to track your baby's growth.  The fetal heartbeat will be listened to.  Any test results from the previous visit will be discussed. Your health care provider may ask you:  How you are feeling.  If you are feeling the baby move.  If you have had any abnormal symptoms, such as leaking fluid, bleeding, severe headaches, or abdominal cramping.  If you are using any tobacco products, including cigarettes, chewing tobacco, and electronic cigarettes.  If you have any questions. Other tests that may be performed during your second trimester include:  Blood tests that check for:  Low iron levels (anemia).  Gestational diabetes (between  24 and 28 weeks).  Rh antibodies.  Urine tests to check for infections, diabetes, or protein in the urine.  An ultrasound to confirm the proper growth and development of the baby.  An amniocentesis to check for possible genetic problems.  Fetal screens for spina bifida and Down syndrome.  HIV (human immunodeficiency virus) testing. Routine prenatal testing includes screening for HIV, unless you choose not to have this test. HOME CARE INSTRUCTIONS   Avoid all smoking, herbs, alcohol, and unprescribed drugs. These chemicals affect the formation and growth of the baby.  Do not use any tobacco products, including cigarettes, chewing  tobacco, and electronic cigarettes. If you need help quitting, ask your health care provider. You may receive counseling support and other resources to help you quit.  Follow your health care provider's instructions regarding medicine use. There are medicines that are either safe or unsafe to take during pregnancy.  Exercise only as directed by your health care provider. Experiencing uterine cramps is a good sign to stop exercising.  Continue to eat regular, healthy meals.  Wear a good support bra for breast tenderness.  Do not use hot tubs, steam rooms, or saunas.  Wear your seat belt at all times when driving.  Avoid raw meat, uncooked cheese, cat litter boxes, and soil used by cats. These carry germs that can cause birth defects in the baby.  Take your prenatal vitamins.  Take 1500-2000 mg of calcium daily starting at the 20th week of pregnancy until you deliver your baby.  Try taking a stool softener (if your health care provider approves) if you develop constipation. Eat more high-fiber foods, such as fresh vegetables or fruit and whole grains. Drink plenty of fluids to keep your urine clear or pale yellow.  Take warm sitz baths to soothe any pain or discomfort caused by hemorrhoids. Use hemorrhoid cream if your health care provider approves.  If you develop varicose veins, wear support hose. Elevate your feet for 15 minutes, 3-4 times a day. Limit salt in your diet.  Avoid heavy lifting, wear low heel shoes, and practice good posture.  Rest with your legs elevated if you have leg cramps or low back pain.  Visit your dentist if you have not gone yet during your pregnancy. Use a soft toothbrush to brush your teeth and be gentle when you floss.  A sexual relationship may be continued unless your health care provider directs you otherwise.  Continue to go to all your prenatal visits as directed by your health care provider. SEEK MEDICAL CARE IF:   You have dizziness.  You  have mild pelvic cramps, pelvic pressure, or nagging pain in the abdominal area.  You have persistent nausea, vomiting, or diarrhea.  You have a bad smelling vaginal discharge.  You have pain with urination. SEEK IMMEDIATE MEDICAL CARE IF:   You have a fever.  You are leaking fluid from your vagina.  You have spotting or bleeding from your vagina.  You have severe abdominal cramping or pain.  You have rapid weight gain or loss.  You have shortness of breath with chest pain.  You notice sudden or extreme swelling of your face, hands, ankles, feet, or legs.  You have not felt your baby move in over an hour.  You have severe headaches that do not go away with medicine.  You have vision changes.   This information is not intended to replace advice given to you by your health care provider. Make sure you discuss any  questions you have with your health care provider.   Document Released: 08/18/2001 Document Revised: 09/14/2014 Document Reviewed: 10/25/2012 Elsevier Interactive Patient Education [employer].

## 2015-07-17 ENCOUNTER — Encounter: Payer: 59 | Admitting: Obstetrics and Gynecology

## 2015-07-31 ENCOUNTER — Other Ambulatory Visit: Payer: Self-pay | Admitting: Obstetrics and Gynecology

## 2015-07-31 DIAGNOSIS — Z789 Other specified health status: Secondary | ICD-10-CM

## 2015-08-14 ENCOUNTER — Ambulatory Visit (INDEPENDENT_AMBULATORY_CARE_PROVIDER_SITE_OTHER): Payer: 59 | Admitting: Obstetrics and Gynecology

## 2015-08-14 ENCOUNTER — Ambulatory Visit (INDEPENDENT_AMBULATORY_CARE_PROVIDER_SITE_OTHER): Payer: Medicaid Other

## 2015-08-14 VITALS — BP 113/63 | HR 94 | Wt 193.1 lb

## 2015-08-14 DIAGNOSIS — Z331 Pregnant state, incidental: Secondary | ICD-10-CM

## 2015-08-14 DIAGNOSIS — Z3492 Encounter for supervision of normal pregnancy, unspecified, second trimester: Secondary | ICD-10-CM | POA: Diagnosis not present

## 2015-08-14 DIAGNOSIS — Z3490 Encounter for supervision of normal pregnancy, unspecified, unspecified trimester: Secondary | ICD-10-CM

## 2015-08-14 NOTE — Progress Notes (Signed)
Indications:Anatomy U/S Findings:  Singleton intrauterine pregnancy is visualized with FHR at 155 BPM. Biometrics give an (U/S) Gestational age of 64 6/7 weeks and an (U/S) EDD of 12/26/15; this does not correlate with the clinically established EDD of 01/03/16.  Fetal presentation is Vertex.  EFW: 384g (14 oz). Placenta: posterior, grade 0, remote to cervix by 6 cm. AFI: adequate with a MVP of 4.2 cm .  Anatomic survey is complete and normal; Gender - female  .   Right Ovary measures 3.1 x 1.7 x 1.9 cm. It is normal in appearance. Left Ovary measures 2.8 x 1.7 x 1.7 cm. It is normal appearance. Survey of the adnexa demonstrates no adnexal masses. There is no free peritoneal fluid in the cul de sac.  Impression: 1. 20 6/7 week Viable Singleton Intrauterine pregnancy by U/S. 2. (U/S) EDD is NOT consistent with Clinically established (LMP) EDD of 01/03/16. 3. Normal Anatomy Scan

## 2015-09-08 NOTE — L&D Delivery Note (Cosign Needed)
Delivery Note At  a viable and healthy female "Waylan" was delivered via  (Presentation: OA ;  ).  APGAR:8/8 ; weight  .  7#8oz Placenta status: , .  Cord:  with the following complications:   Anesthesia:  epidural Episiotomy:  none Lacerations:  1st degree Suture Repair: 3.0 vicryl rapide Est. Blood Loss (mL):  250  Mom to postpartum.  Baby to Couplet care / Skin to Skin.  Khizar Fiorella N Jaydenn Boccio,CNM 12/02/2015, 5:48 PM

## 2015-09-11 ENCOUNTER — Encounter: Payer: 59 | Admitting: Obstetrics and Gynecology

## 2015-09-20 ENCOUNTER — Ambulatory Visit (INDEPENDENT_AMBULATORY_CARE_PROVIDER_SITE_OTHER): Payer: Medicaid Other | Admitting: Obstetrics and Gynecology

## 2015-09-20 ENCOUNTER — Encounter: Payer: Self-pay | Admitting: Obstetrics and Gynecology

## 2015-09-20 ENCOUNTER — Other Ambulatory Visit: Payer: Self-pay | Admitting: Obstetrics and Gynecology

## 2015-09-20 VITALS — BP 105/75 | HR 96 | Wt 198.1 lb

## 2015-09-20 DIAGNOSIS — D229 Melanocytic nevi, unspecified: Secondary | ICD-10-CM | POA: Diagnosis not present

## 2015-09-20 DIAGNOSIS — Z3490 Encounter for supervision of normal pregnancy, unspecified, unspecified trimester: Secondary | ICD-10-CM

## 2015-09-20 DIAGNOSIS — Z331 Pregnant state, incidental: Secondary | ICD-10-CM

## 2015-09-20 NOTE — Progress Notes (Signed)
ROB- doing well, glucola next visit; suspicious nevi on right side of neck removed without difficulty- sent for pathology.                                                                                                                                            ``  `

## 2015-09-20 NOTE — Progress Notes (Signed)
ROB-pt has some moments" where she just dont feel good",

## 2015-10-09 ENCOUNTER — Other Ambulatory Visit: Payer: Self-pay | Admitting: *Deleted

## 2015-10-09 ENCOUNTER — Encounter: Payer: Self-pay | Admitting: Obstetrics and Gynecology

## 2015-10-09 ENCOUNTER — Other Ambulatory Visit: Payer: Self-pay

## 2015-10-09 ENCOUNTER — Ambulatory Visit (INDEPENDENT_AMBULATORY_CARE_PROVIDER_SITE_OTHER): Payer: Medicaid Other | Admitting: Obstetrics and Gynecology

## 2015-10-09 VITALS — BP 130/65 | HR 97 | Wt 202.1 lb

## 2015-10-09 DIAGNOSIS — Z331 Pregnant state, incidental: Secondary | ICD-10-CM

## 2015-10-09 DIAGNOSIS — Z23 Encounter for immunization: Secondary | ICD-10-CM | POA: Diagnosis not present

## 2015-10-09 DIAGNOSIS — Z131 Encounter for screening for diabetes mellitus: Secondary | ICD-10-CM

## 2015-10-09 LAB — POCT URINALYSIS DIPSTICK
Bilirubin, UA: NEGATIVE
Blood, UA: NEGATIVE
GLUCOSE UA: NEGATIVE
KETONES UA: NEGATIVE
Leukocytes, UA: NEGATIVE
Nitrite, UA: NEGATIVE
PROTEIN UA: NEGATIVE
Urobilinogen, UA: 0.2
pH, UA: 7.5

## 2015-10-09 MED ORDER — TETANUS-DIPHTH-ACELL PERTUSSIS 5-2.5-18.5 LF-MCG/0.5 IM SUSP
0.5000 mL | Freq: Once | INTRAMUSCULAR | Status: AC
  Administered 2015-10-09: 0.5 mL via INTRAMUSCULAR

## 2015-10-09 NOTE — Progress Notes (Signed)
ROB-doing well, saw Nutritionist yesterday

## 2015-10-09 NOTE — Progress Notes (Signed)
ROB-glucola done, blood consent signed, tdap given Pt states she is doing ok, still having spells where she doesn't feel good

## 2015-10-09 NOTE — Patient Instructions (Signed)

## 2015-10-10 ENCOUNTER — Other Ambulatory Visit: Payer: Self-pay | Admitting: Obstetrics and Gynecology

## 2015-10-10 DIAGNOSIS — E559 Vitamin D deficiency, unspecified: Secondary | ICD-10-CM | POA: Insufficient documentation

## 2015-10-10 DIAGNOSIS — O99019 Anemia complicating pregnancy, unspecified trimester: Secondary | ICD-10-CM | POA: Insufficient documentation

## 2015-10-10 LAB — HEMOGLOBIN AND HEMATOCRIT, BLOOD
Hematocrit: 33.2 % — ABNORMAL LOW (ref 34.0–46.6)
Hemoglobin: 10.6 g/dL — ABNORMAL LOW (ref 11.1–15.9)

## 2015-10-10 LAB — GLUCOSE, 1 HOUR GESTATIONAL: GESTATIONAL DIABETES SCREEN: 105 mg/dL (ref 65–139)

## 2015-10-10 MED ORDER — FUSION PLUS PO CAPS: 1.0000 | ORAL_CAPSULE | Freq: Every day | ORAL | Status: DC

## 2015-10-10 MED ORDER — VITAMIN D (ERGOCALCIFEROL) 1.25 MG (50000 UNIT) PO CAPS: 50000.0000 [IU] | ORAL_CAPSULE | ORAL | Status: DC

## 2015-10-11 LAB — VITAMIN D 25 HYDROXY (VIT D DEFICIENCY, FRACTURES): Vit D, 25-Hydroxy: 14.5 ng/mL — ABNORMAL LOW (ref 30.0–100.0)

## 2015-10-11 LAB — VITAMIN B12: VITAMIN B 12: 427 pg/mL (ref 211–946)

## 2015-10-14 ENCOUNTER — Encounter: Payer: Self-pay | Admitting: Internal Medicine

## 2015-10-14 ENCOUNTER — Ambulatory Visit: Payer: Medicaid Other | Admitting: Family Medicine

## 2015-10-14 ENCOUNTER — Ambulatory Visit (INDEPENDENT_AMBULATORY_CARE_PROVIDER_SITE_OTHER): Payer: Medicaid Other | Admitting: Internal Medicine

## 2015-10-14 ENCOUNTER — Telehealth: Payer: Self-pay | Admitting: *Deleted

## 2015-10-14 VITALS — BP 126/72 | HR 112 | Temp 97.9°F | Resp 12 | Ht 64.0 in | Wt 203.2 lb

## 2015-10-14 DIAGNOSIS — R05 Cough: Secondary | ICD-10-CM | POA: Diagnosis not present

## 2015-10-14 DIAGNOSIS — B9789 Other viral agents as the cause of diseases classified elsewhere: Secondary | ICD-10-CM

## 2015-10-14 DIAGNOSIS — J069 Acute upper respiratory infection, unspecified: Secondary | ICD-10-CM | POA: Diagnosis not present

## 2015-10-14 DIAGNOSIS — R059 Cough, unspecified: Secondary | ICD-10-CM

## 2015-10-14 LAB — POCT INFLUENZA A/B
Influenza A, POC: NEGATIVE
Influenza B, POC: NEGATIVE

## 2015-10-14 NOTE — Progress Notes (Signed)
Pre-visit discussion using our clinic review tool. No additional management support is needed unless otherwise documented below in the visit note.  

## 2015-10-14 NOTE — Assessment & Plan Note (Signed)
.  Rapid flu test is negative and exam is normal. URI is most likely viral given the mild HEENT  Symptoms  And normal exam.   I have explained that in viral URIS, an antibiotic will not help the symptoms and will increase the risk of developing diarrhea.,  Advise to use saline and nasal decongestants, and tylenol 650 mq 8 hrs for aches and pains,  And will prednisone  Taper if needed for inflammation

## 2015-10-14 NOTE — Telephone Encounter (Signed)
-----   Message from Joylene Igo, [place] sent at 10/10/2015 11:14 AM EST ----- Please send info on Fusion and Vit D def.

## 2015-10-14 NOTE — Patient Instructions (Signed)
Tommi,  You have a viral infection but it is not the flu.  Use tylenol and lots of fluids,  And rest  Afrin nasal spray and Neil Med's Sinus rinse to keep your sinuses open (to PREVENT conversion to bacterial sinusitis)  If you develop fevers (T > 101) ,  Green phlegm or sinus pain,, call for antibiotic.  If you just develop hacking cough without fever,  I will call you in a prednisone taper to take for 6 days

## 2015-10-14 NOTE — Progress Notes (Signed)
Subjective:  Patient ID: Julia Gay, female    DOB: [birth year]  Age: 35 y.o. MRN: [id number]  CC: The primary encounter diagnosis was Cough. A diagnosis of Viral URI with cough was also pertinent to this visit.  HPI Julia Gay presents for recent onset of scratchy throat and nonproductive cough.  Patient is  7 months pregnant and is concerned about flu and bacterial infections.  She denies fevers,  But has has some body aches.  No rash.  No sick contacts,  No nausea or diarrhea. .   Outpatient Prescriptions Prior to Visit  Medication Sig Dispense Refill  . Iron-FA-B Cmp-C-Biot-Probiotic (FUSION PLUS) CAPS Take 1 capsule by mouth daily. 60 capsule 1  . metroNIDAZOLE (METROCREAM) 0.75 % cream Apply topically 2 (two) times daily. Reported on 10/09/2015    . Prenatal Vit-Fe Fumarate-FA (PRENATAL MULTIVITAMIN) TABS tablet Take 1 tablet by mouth daily at 12 noon.    . sertraline (ZOLOFT) 50 MG tablet Take 1 tablet by mouth daily.    . vitamin B-12 (CYANOCOBALAMIN) 100 MCG tablet Take 100 mcg by mouth daily.    . Vitamin D, Ergocalciferol, (DRISDOL) 50000 units CAPS capsule Take 1 capsule (50,000 Units total) by mouth 2 (two) times a week. 30 capsule 2  . amoxicillin-clavulanate (AUGMENTIN) 875-125 MG tablet Take 1 tablet by mouth 2 (two) times daily. (Patient not taking: Reported on 07/16/2015) 20 tablet 0   No facility-administered medications prior to visit.    Review of Systems;  Patient denies headache, fevers, malaise, unintentional weight loss, skin rash, eye pain, sinus congestion and sinus pain, sore throat, dysphagia,  hemoptysis , cough, dyspnea, wheezing, chest pain, palpitations, orthopnea, edema, abdominal pain, nausea, melena, diarrhea, constipation, flank pain, dysuria, hematuria, urinary  Frequency, nocturia, numbness, tingling, seizures,  Focal weakness, Loss of consciousness,  Tremor, insomnia, depression, anxiety, and suicidal ideation.      Objective:  BP 126/72 mmHg   Pulse 112  Temp(Src) 97.9 F (36.6 C) (Oral)  Resp 12  Ht 5\' 4"  (1.626 m)  Wt 203 lb 4 oz (92.194 kg)  BMI 34.87 kg/m2  SpO2 98%  LMP 03/29/2015 (Exact Date)  BP Readings from Last 3 Encounters:  10/14/15 126/72  10/09/15 130/65  09/20/15 105/75    Wt Readings from Last 3 Encounters:  10/14/15 203 lb 4 oz (92.194 kg)  10/09/15 202 lb 1.6 oz (91.672 kg)  09/20/15 198 lb 1.6 oz (89.858 kg)    General appearance: alert, cooperative and appears stated age Ears: normal TM's and external ear canals both ears Throat: lips, mucosa, and tongue normal; teeth and gums normal Neck: no adenopathy, no carotid bruit, supple, symmetrical, trachea midline and thyroid not enlarged, symmetric, no tenderness/mass/nodules Back: symmetric, no curvature. ROM normal. No CVA tenderness. Lungs: clear to auscultation bilaterally Heart: regular rate and rhythm, S1, S2 normal, no murmur, click, rub or gallop Abdomen: soft, non-tender; bowel sounds normal; no masses,  no organomegaly Pulses: 2+ and symmetric Skin: Skin color, texture, turgor normal. No rashes or lesions Lymph nodes: Cervical, supraclavicular, and axillary nodes normal.  Lab Results  Component Value Date   HGBA1C 5.0 05/14/2015    Lab Results  Component Value Date   CREATININE 0.60 05/14/2015   CREATININE 0.69 06/01/2012   CREATININE 0.7 04/20/2009    Lab Results  Component Value Date   WBC 8.1 05/30/2015   HGB 11.7* 05/14/2015   HCT 33.2* 10/09/2015   PLT 295 05/30/2015   GLUCOSE 85 05/14/2015   CHOL  168 05/14/2015   TRIG 74.0 05/14/2015   HDL 43.20 05/14/2015   LDLCALC 110* 05/14/2015   ALT 14 05/14/2015   AST 16 05/14/2015   NA 136 05/14/2015   K 4.1 05/14/2015   CL 102 05/14/2015   CREATININE 0.60 05/14/2015   BUN 10 05/14/2015   CO2 26 05/14/2015   TSH 1.67 05/14/2015   HGBA1C 5.0 05/14/2015    No results found.  Assessment & Plan:   Problem List Items Addressed This Visit    Viral URI with cough     .Rapid flu test is negative and exam is normal. URI is most likely viral given the mild HEENT  Symptoms  And normal exam.   I have explained that in viral URIS, an antibiotic will not help the symptoms and will increase the risk of developing diarrhea.,  Advise to use saline and nasal decongestants, and tylenol 650 mq 8 hrs for aches and pains,  And will prednisone  Taper if needed for inflammation       Other Visit Diagnoses    Cough    -  Primary    Relevant Orders    POCT Influenza A/B (Completed)       I have discontinued [name]'s amoxicillin-clavulanate. I am also having her maintain her sertraline, metroNIDAZOLE, prenatal multivitamin, vitamin B-12, FUSION PLUS, and Vitamin D (Ergocalciferol).  No orders of the defined types were placed in this encounter.    Medications Discontinued During This Encounter  Medication Reason  . amoxicillin-clavulanate (AUGMENTIN) 875-125 MG tablet     Follow-up: No Follow-up on file.   Crecencio Mc, MD

## 2015-10-14 NOTE — Telephone Encounter (Signed)
Mailed info on vit d and fusion plus

## 2015-10-15 ENCOUNTER — Ambulatory Visit: Payer: Medicaid Other | Admitting: Primary Care

## 2015-10-16 ENCOUNTER — Encounter: Payer: Self-pay | Admitting: Internal Medicine

## 2015-10-17 ENCOUNTER — Other Ambulatory Visit: Payer: Self-pay | Admitting: Internal Medicine

## 2015-10-17 MED ORDER — AMOXICILLIN 500 MG PO CAPS: 500.0000 mg | ORAL_CAPSULE | Freq: Three times a day (TID) | ORAL | Status: DC

## 2015-10-17 NOTE — Telephone Encounter (Signed)
Spoke with Julia Gay, On the phone.  She is having large amounts of green phlegm, very productive cough, no fevers, had chills last night.  She is miserable with the sinus pressure.  She will be 18months pregnant next week. Your OV from Monday states to call for all these symptoms for a antibiotic vs. Prednisone.  Please advise.  Prescription to [facility] on [address].  Thanks

## 2015-10-24 ENCOUNTER — Encounter: Payer: Medicaid Other | Admitting: Obstetrics and Gynecology

## 2015-10-24 ENCOUNTER — Encounter: Payer: Self-pay | Admitting: Obstetrics and Gynecology

## 2015-10-24 ENCOUNTER — Ambulatory Visit (INDEPENDENT_AMBULATORY_CARE_PROVIDER_SITE_OTHER): Payer: Medicaid Other | Admitting: Obstetrics and Gynecology

## 2015-10-24 VITALS — BP 88/61 | HR 127 | Wt 206.5 lb

## 2015-10-24 DIAGNOSIS — Z331 Pregnant state, incidental: Secondary | ICD-10-CM

## 2015-10-24 LAB — POCT URINALYSIS DIPSTICK
Bilirubin, UA: NEGATIVE
GLUCOSE UA: NEGATIVE
Ketones, UA: NEGATIVE
Leukocytes, UA: NEGATIVE
NITRITE UA: NEGATIVE
PH UA: 6.5
RBC UA: NEGATIVE
Spec Grav, UA: 1.01
UROBILINOGEN UA: 0.2

## 2015-10-24 NOTE — Progress Notes (Signed)
ROB- doing well, plans breastfeeding and withdrawal PP.

## 2015-10-24 NOTE — Progress Notes (Signed)
ROB-pt has been sick all last week, she is ready to feel better

## 2015-11-07 ENCOUNTER — Ambulatory Visit (INDEPENDENT_AMBULATORY_CARE_PROVIDER_SITE_OTHER): Payer: Medicaid Other | Admitting: Obstetrics and Gynecology

## 2015-11-07 ENCOUNTER — Encounter: Payer: Self-pay | Admitting: Obstetrics and Gynecology

## 2015-11-07 VITALS — BP 110/64 | HR 83 | Wt 206.6 lb

## 2015-11-07 DIAGNOSIS — Z331 Pregnant state, incidental: Secondary | ICD-10-CM

## 2015-11-07 LAB — POCT URINALYSIS DIPSTICK
Bilirubin, UA: NEGATIVE
Blood, UA: NEGATIVE
GLUCOSE UA: NEGATIVE
Ketones, UA: NEGATIVE
LEUKOCYTES UA: NEGATIVE
NITRITE UA: NEGATIVE
PROTEIN UA: NEGATIVE
Spec Grav, UA: 1.01
UROBILINOGEN UA: 0.2
pH, UA: 7

## 2015-11-07 NOTE — Progress Notes (Signed)
ROB-feeling better, taking Vit D & iron.

## 2015-11-07 NOTE — Progress Notes (Signed)
ROB-pt is feeling some better, denies any new complaints

## 2015-11-21 ENCOUNTER — Ambulatory Visit (INDEPENDENT_AMBULATORY_CARE_PROVIDER_SITE_OTHER): Payer: Medicaid Other | Admitting: Obstetrics and Gynecology

## 2015-11-21 ENCOUNTER — Encounter: Payer: Self-pay | Admitting: Obstetrics and Gynecology

## 2015-11-21 ENCOUNTER — Encounter: Payer: Medicaid Other | Admitting: Obstetrics and Gynecology

## 2015-11-21 VITALS — BP 109/62 | HR 90 | Wt 210.0 lb

## 2015-11-21 DIAGNOSIS — Z331 Pregnant state, incidental: Secondary | ICD-10-CM

## 2015-11-21 LAB — POCT URINALYSIS DIPSTICK
BILIRUBIN UA: NEGATIVE
GLUCOSE UA: NEGATIVE
KETONES UA: NEGATIVE
LEUKOCYTES UA: NEGATIVE
NITRITE UA: NEGATIVE
Protein, UA: NEGATIVE
RBC UA: NEGATIVE
SPEC GRAV UA: 1.015
Urobilinogen, UA: 0.2
pH, UA: 6

## 2015-11-21 NOTE — Progress Notes (Signed)
ROB- doing well, lungs clear on exam, cultures next visit.

## 2015-11-21 NOTE — Progress Notes (Signed)
ROB-pt is wheezing, possible some allergies

## 2015-12-02 ENCOUNTER — Inpatient Hospital Stay: Payer: Medicaid Other | Admitting: Anesthesiology

## 2015-12-02 ENCOUNTER — Inpatient Hospital Stay
Admission: EM | Admit: 2015-12-02 | Discharge: 2015-12-04 | DRG: 775 | Disposition: A | Discharge status: Home / Self Care | Payer: Medicaid Other | Attending: Obstetrics and Gynecology | Admitting: Obstetrics and Gynecology

## 2015-12-02 ENCOUNTER — Encounter: Payer: Self-pay | Admitting: *Deleted

## 2015-12-02 DIAGNOSIS — Z87891 Personal history of nicotine dependence: Secondary | ICD-10-CM | POA: Diagnosis not present

## 2015-12-02 DIAGNOSIS — O4290 Premature rupture of membranes, unspecified as to length of time between rupture and onset of labor, unspecified weeks of gestation: Secondary | ICD-10-CM

## 2015-12-02 DIAGNOSIS — R230 Cyanosis: Secondary | ICD-10-CM | POA: Diagnosis present

## 2015-12-02 DIAGNOSIS — O42913 Preterm premature rupture of membranes, unspecified as to length of time between rupture and onset of labor, third trimester: Secondary | ICD-10-CM | POA: Diagnosis present

## 2015-12-02 DIAGNOSIS — Z79899 Other long term (current) drug therapy: Secondary | ICD-10-CM

## 2015-12-02 DIAGNOSIS — Z823 Family history of stroke: Secondary | ICD-10-CM

## 2015-12-02 DIAGNOSIS — Z87440 Personal history of urinary (tract) infections: Secondary | ICD-10-CM

## 2015-12-02 DIAGNOSIS — O99019 Anemia complicating pregnancy, unspecified trimester: Secondary | ICD-10-CM

## 2015-12-02 DIAGNOSIS — Z23 Encounter for immunization: Secondary | ICD-10-CM | POA: Diagnosis not present

## 2015-12-02 DIAGNOSIS — O1494 Unspecified pre-eclampsia, complicating childbirth: Secondary | ICD-10-CM | POA: Diagnosis present

## 2015-12-02 DIAGNOSIS — Z3A35 35 weeks gestation of pregnancy: Secondary | ICD-10-CM

## 2015-12-02 DIAGNOSIS — Z811 Family history of alcohol abuse and dependence: Secondary | ICD-10-CM | POA: Diagnosis not present

## 2015-12-02 DIAGNOSIS — Z8249 Family history of ischemic heart disease and other diseases of the circulatory system: Secondary | ICD-10-CM | POA: Diagnosis not present

## 2015-12-02 DIAGNOSIS — Z3493 Encounter for supervision of normal pregnancy, unspecified, third trimester: Secondary | ICD-10-CM

## 2015-12-02 DIAGNOSIS — E559 Vitamin D deficiency, unspecified: Secondary | ICD-10-CM

## 2015-12-02 LAB — CBC
HEMATOCRIT: 33.1 % — AB (ref 35.0–47.0)
HEMOGLOBIN: 11.2 g/dL — AB (ref 12.0–16.0)
MCH: 28.5 pg (ref 26.0–34.0)
MCHC: 33.8 g/dL (ref 32.0–36.0)
MCV: 84.4 fL (ref 80.0–100.0)
Platelets: 200 10*3/uL (ref 150–440)
RBC: 3.91 MIL/uL (ref 3.80–5.20)
RDW: 14 % (ref 11.5–14.5)
WBC: 14.1 10*3/uL — AB (ref 3.6–11.0)

## 2015-12-02 LAB — TYPE AND SCREEN
ABO/RH(D): A POS
ANTIBODY SCREEN: NEGATIVE

## 2015-12-02 LAB — ABO/RH: ABO/RH(D): A POS

## 2015-12-02 MED ORDER — DIPHENHYDRAMINE HCL 25 MG PO CAPS: 25.0000 mg | ORAL_CAPSULE | ORAL | Status: DC | PRN

## 2015-12-02 MED ORDER — LIDOCAINE-EPINEPHRINE (PF) 1.5 %-1:200000 IJ SOLN
INTRAMUSCULAR | Status: DC | PRN
  Administered 2015-12-02: 3 mL via PERINEURAL

## 2015-12-02 MED ORDER — OXYTOCIN BOLUS FROM INFUSION
500.0000 mL | INTRAVENOUS | Status: DC
Start: 2015-12-02 — End: 2015-12-02

## 2015-12-02 MED ORDER — FENTANYL CITRATE (PF) 100 MCG/2ML IJ SOLN: 50.0000 ug | INTRAMUSCULAR | Status: DC | PRN

## 2015-12-02 MED ORDER — ZOLPIDEM TARTRATE 5 MG PO TABS: 5.0000 mg | ORAL_TABLET | Freq: Every evening | ORAL | Status: DC | PRN

## 2015-12-02 MED ORDER — SIMETHICONE 80 MG PO CHEW: 80.0000 mg | CHEWABLE_TABLET | ORAL | Status: DC | PRN

## 2015-12-02 MED ORDER — LANOLIN HYDROUS EX OINT: TOPICAL_OINTMENT | CUTANEOUS | Status: DC | PRN

## 2015-12-02 MED ORDER — IBUPROFEN 600 MG PO TABS
600.0000 mg | ORAL_TABLET | Freq: Four times a day (QID) | ORAL | Status: DC
  Administered 2015-12-03 – 2015-12-04 (×5): 600 mg via ORAL
  Filled 2015-12-02 (×5): qty 1

## 2015-12-02 MED ORDER — ONDANSETRON HCL 4 MG/2ML IJ SOLN: 4.0000 mg | INTRAMUSCULAR | Status: DC | PRN

## 2015-12-02 MED ORDER — BENZOCAINE-MENTHOL 20-0.5 % EX AERO: 1.0000 "application " | INHALATION_SPRAY | CUTANEOUS | Status: DC | PRN

## 2015-12-02 MED ORDER — SODIUM CHLORIDE 0.9% FLUSH: 3.0000 mL | INTRAVENOUS | Status: DC | PRN

## 2015-12-02 MED ORDER — NALOXONE HCL 0.4 MG/ML IJ SOLN: 0.4000 mg | INTRAMUSCULAR | Status: DC | PRN

## 2015-12-02 MED ORDER — WITCH HAZEL-GLYCERIN EX PADS: 1.0000 "application " | MEDICATED_PAD | CUTANEOUS | Status: DC | PRN

## 2015-12-02 MED ORDER — TERBUTALINE SULFATE 1 MG/ML IJ SOLN: 0.2500 mg | Freq: Once | INTRAMUSCULAR | Status: DC | PRN

## 2015-12-02 MED ORDER — DIBUCAINE 1 % RE OINT: 1.0000 "application " | TOPICAL_OINTMENT | RECTAL | Status: DC | PRN

## 2015-12-02 MED ORDER — AMPICILLIN SODIUM 1 G IJ SOLR
1.0000 g | INTRAMUSCULAR | Status: DC
  Administered 2015-12-02 (×2): 1 g via INTRAVENOUS
  Filled 2015-12-02 (×3): qty 1000

## 2015-12-02 MED ORDER — FENTANYL 2.5 MCG/ML W/ROPIVACAINE 0.2% IN NS 100 ML EPIDURAL INFUSION (ARMC-ANES)
EPIDURAL | Status: AC
  Filled 2015-12-02: qty 100

## 2015-12-02 MED ORDER — NALBUPHINE HCL 10 MG/ML IJ SOLN: 5.0000 mg | Freq: Once | INTRAMUSCULAR | Status: DC | PRN

## 2015-12-02 MED ORDER — BUPIVACAINE HCL (PF) 0.25 % IJ SOLN
INTRAMUSCULAR | Status: DC | PRN
  Administered 2015-12-02 (×2): 5 mL via EPIDURAL

## 2015-12-02 MED ORDER — NALBUPHINE HCL 10 MG/ML IJ SOLN: 5.0000 mg | INTRAMUSCULAR | Status: DC | PRN

## 2015-12-02 MED ORDER — LIDOCAINE HCL (PF) 1 % IJ SOLN: 30.0000 mL | INTRAMUSCULAR | Status: DC | PRN

## 2015-12-02 MED ORDER — ONDANSETRON HCL 4 MG/2ML IJ SOLN: 4.0000 mg | Freq: Four times a day (QID) | INTRAMUSCULAR | Status: DC | PRN

## 2015-12-02 MED ORDER — SODIUM CHLORIDE 0.9 % IV SOLN
INTRAVENOUS | Status: AC
  Filled 2015-12-02: qty 2000

## 2015-12-02 MED ORDER — ONDANSETRON HCL 4 MG PO TABS: 4.0000 mg | ORAL_TABLET | ORAL | Status: DC | PRN

## 2015-12-02 MED ORDER — DIPHENHYDRAMINE HCL 25 MG PO CAPS
25.0000 mg | ORAL_CAPSULE | Freq: Four times a day (QID) | ORAL | Status: DC | PRN
  Administered 2015-12-02: 25 mg via ORAL
  Filled 2015-12-02: qty 1

## 2015-12-02 MED ORDER — DIPHENHYDRAMINE HCL 50 MG/ML IJ SOLN: 12.5000 mg | INTRAMUSCULAR | Status: DC | PRN

## 2015-12-02 MED ORDER — LACTATED RINGERS IV SOLN
INTRAVENOUS | Status: DC
  Administered 2015-12-02 (×2): 1000 mL via INTRAVENOUS

## 2015-12-02 MED ORDER — SODIUM CHLORIDE 0.9 % IV SOLN
2.0000 g | Freq: Once | INTRAVENOUS | Status: AC
  Administered 2015-12-02: 2 g via INTRAVENOUS

## 2015-12-02 MED ORDER — SERTRALINE HCL 50 MG PO TABS
50.0000 mg | ORAL_TABLET | Freq: Every day | ORAL | Status: DC
  Administered 2015-12-03 – 2015-12-04 (×2): 50 mg via ORAL
  Filled 2015-12-02 (×3): qty 1

## 2015-12-02 MED ORDER — DOCUSATE SODIUM 100 MG PO CAPS
100.0000 mg | ORAL_CAPSULE | Freq: Two times a day (BID) | ORAL | Status: DC
  Administered 2015-12-02 – 2015-12-04 (×4): 100 mg via ORAL
  Filled 2015-12-02 (×5): qty 1

## 2015-12-02 MED ORDER — FENTANYL 2.5 MCG/ML W/ROPIVACAINE 0.2% IN NS 100 ML EPIDURAL INFUSION (ARMC-ANES)
10.0000 mL/h | EPIDURAL | Status: DC
  Administered 2015-12-02: 10 mL/h via EPIDURAL

## 2015-12-02 MED ORDER — ACETAMINOPHEN 325 MG PO TABS: 650.0000 mg | ORAL_TABLET | ORAL | Status: DC | PRN

## 2015-12-02 MED ORDER — OXYTOCIN 40 UNITS IN LACTATED RINGERS INFUSION - SIMPLE MED: 2.5000 [IU]/h | INTRAVENOUS | Status: DC

## 2015-12-02 MED ORDER — ONDANSETRON HCL 4 MG/2ML IJ SOLN: 4.0000 mg | Freq: Three times a day (TID) | INTRAMUSCULAR | Status: DC | PRN

## 2015-12-02 MED ORDER — CITRIC ACID-SODIUM CITRATE 334-500 MG/5ML PO SOLN: 30.0000 mL | ORAL | Status: DC | PRN

## 2015-12-02 MED ORDER — LIDOCAINE HCL (PF) 1 % IJ SOLN
INTRAMUSCULAR | Status: DC | PRN
  Administered 2015-12-02: 3 mL

## 2015-12-02 MED ORDER — VARICELLA VIRUS VACCINE LIVE 1350 PFU/0.5ML IJ SUSR
0.5000 mL | Freq: Once | INTRAMUSCULAR | Status: AC
  Administered 2015-12-04: 0.5 mL via SUBCUTANEOUS
  Filled 2015-12-02 (×2): qty 0.5

## 2015-12-02 MED ORDER — IBUPROFEN 600 MG PO TABS
600.0000 mg | ORAL_TABLET | Freq: Four times a day (QID) | ORAL | Status: DC
  Administered 2015-12-02: 600 mg via ORAL
  Filled 2015-12-02: qty 1

## 2015-12-02 MED ORDER — DEXTROSE 5 % IV SOLN
1.0000 ug/kg/h | INTRAVENOUS | Status: DC | PRN
  Filled 2015-12-02: qty 2

## 2015-12-02 MED ORDER — OXYTOCIN 40 UNITS IN LACTATED RINGERS INFUSION - SIMPLE MED
1.0000 m[IU]/min | INTRAVENOUS | Status: DC
  Administered 2015-12-02: 1 m[IU]/min via INTRAVENOUS
  Filled 2015-12-02: qty 1000

## 2015-12-02 MED ORDER — LACTATED RINGERS IV SOLN: 500.0000 mL | INTRAVENOUS | Status: DC | PRN

## 2015-12-02 MED ORDER — MEASLES, MUMPS & RUBELLA VAC ~~LOC~~ INJ
0.5000 mL | INJECTION | Freq: Once | SUBCUTANEOUS | Status: AC
  Administered 2015-12-04: 0.5 mL via SUBCUTANEOUS
  Filled 2015-12-02 (×2): qty 0.5

## 2015-12-02 MED ORDER — PRENATAL MULTIVITAMIN CH
1.0000 | ORAL_TABLET | Freq: Every day | ORAL | Status: DC
  Administered 2015-12-03: 1 via ORAL
  Filled 2015-12-02: qty 1

## 2015-12-02 NOTE — Lactation Note (Signed)
This note was copied from a baby's chart. Lactation Consultation Note  Patient Name: Boy Basilisa Bozarth [id number] Date: 12/02/2015 Reason for consult: Initial assessment Assisted mom with waking Waylon.  Placed in biological cross cradle hold skin to skin.  He took a few good sucks.  Having difficulty getting wide open mouth for deep sustained latch.  He continues to resort to shallow latch.  Nipples semi flat, but areola is compressible with some eversion when stimulated.  Can easily hand express colostrum into Waylon's mouth and swallows heard.  Discussed routine newborn feeding patterns and normal course of lactation.  Praised mom for breast feeding and encouraged to keep him skin to skin and allowing him to continue to lick and learn.   Maternal Data Formula Feeding for Exclusion: No Has patient been taught Hand Expression?: Yes Does the patient have breastfeeding experience prior to this delivery?: No (Did not BF 1st over 12 years ago)  Feeding Feeding Type: Breast Fed Length of feed:  (Took few good suck-kept hand expressing colostrum in mouth)  LATCH Score/Interventions Latch: Repeated attempts needed to sustain latch, nipple held in mouth throughout feeding, stimulation needed to elicit sucking reflex. (Unable to get wide mouth deep latch - shallow latch) Intervention(s): Adjust position;Assist with latch;Breast massage;Breast compression  Audible Swallowing: A few with stimulation Intervention(s): Skin to skin;Hand expression  Type of Nipple: Everted at rest and after stimulation (Semi flat - everts with stimulation)  Comfort (Breast/Nipple): Soft / non-tender     Hold (Positioning): Full assist, staff holds infant at breast Intervention(s): Breastfeeding basics reviewed;Support Pillows;Position options;Skin to skin  LATCH Score: 6  Lactation Tools Discussed/Used WIC Program: Yes   Consult Status Consult Status: PRN    Jarold Motto 12/02/2015, 7:50 PM

## 2015-12-02 NOTE — H&P (Cosign Needed)
Obstetric History and Physical  Julia Gay is a 35 y.o. G3P1010 with IUP at [redacted]w[redacted]d presenting with PPROM . Patient states she has been having  irregular, every 10 minutes contractions, none vaginal bleeding, ruptured, clear fluid membranes, with active fetal movement.    Prenatal Course Source of Care: [facility]  Pregnancy complications or risks:AMA,Anemic, Vegetarian,elevated BMI  Prenatal labs and studies: ABO, Rh: --/Positive/-- 06-05-2023 0940) Antibody: Negative 06/05/2023 0939) Rubella: <20.0 2023/06/05 0939) RPR: Non Reactive 06/05/23 0939)  HBsAg: Negative 2023-06-05 0939)  HIV: Non Reactive Jun 05, 2023 0939)  GBS: Unknown 1 hr Glucola  normal Genetic screening low risk female Anatomy US normal  Past Medical History  Diagnosis Date  . Hay fever   . History of UTI   . History of HPV infection     Past Surgical History  Procedure Laterality Date  . Leep  2006    OB History  Gravida Para Term Preterm AB SAB TAB Ectopic Multiple Living  3 1 1  1 1         # Outcome Date GA Lbr Len/2nd Weight Sex Delivery Anes PTL Lv  3 Current           2 SAB 12/2014        FD  1 Term 01/17/02 [redacted]w[redacted]d  7 lb 6 oz (3.345 kg) M Vag-Spont  N      Complications: Preeclampsia complicating hypertension      Social History   Social History  . Marital Status: Married    Spouse Name: N/A  . Number of Children: N/A  . Years of Education: N/A   Social History Main Topics  . Smoking status: Former Research scientist (life sciences)  . Smokeless tobacco: Never Used     Comment: quit in 2004  . Alcohol Use: 0.0 oz/week    0 Standard drinks or equivalent per week     Comment: socially  . Drug Use: No  . Sexual Activity: Yes    Birth Control/ Protection: None   Other Topics Concern  . None   Social History Narrative   Married.    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   Has 1 child.   Enjoys playing soccer and tennis, singing, watching TV.       Family History  Problem Relation Age of Onset  . Alcohol abuse Father    quit when pt was a child  . Alcohol abuse Other     both grandparents  . Heart disease Father   . Stroke Father   . Hypertension Father   . Anemia Mother   . Aplastic anemia Father     Prescriptions prior to admission  Medication Sig Dispense Refill Last Dose  . amoxicillin (AMOXIL) 500 MG capsule Take 1 capsule (500 mg total) by mouth 3 (three) times daily. (Patient not taking: Reported on 11/07/2015) 30 capsule 0 Not Taking  . Iron-FA-B Cmp-C-Biot-Probiotic (FUSION PLUS) CAPS Take 1 capsule by mouth daily. 60 capsule 1 Taking  . metroNIDAZOLE (METROCREAM) 0.75 % cream Apply topically 2 (two) times daily. Reported on 11/21/2015   Not Taking  . Prenatal Vit-Fe Fumarate-FA (PRENATAL MULTIVITAMIN) TABS tablet Take 1 tablet by mouth daily at 12 noon.   Taking  . sertraline (ZOLOFT) 50 MG tablet Take 1 tablet by mouth daily.   Taking  . vitamin B-12 (CYANOCOBALAMIN) 100 MCG tablet Take 100 mcg by mouth daily.   Taking  . Vitamin D, Ergocalciferol, (DRISDOL) 50000 units CAPS capsule Take 1 capsule (50,000 Units total) by  mouth 2 (two) times a week. 30 capsule 2 Taking    No Known Allergies  Review of Systems: Negative except for what is mentioned in HPI.  Physical Exam: BP 104/60 mmHg  Pulse 87  Temp(Src) 98.5 F (36.9 C) (Oral)  Resp 19  Ht 5\' 4"  (1.626 m)  Wt 210 lb (95.255 kg)  BMI 36.03 kg/m2  LMP 03/29/2015 (Exact Date) GENERAL: Well-developed, well-nourished female in no acute distress.  LUNGS: Clear to auscultation bilaterally.  HEART: Regular rate and rhythm. ABDOMEN: Soft, nontender, nondistended, gravid. EXTREMITIES: Nontender, no edema, 2+ distal pulses. Cervical Exam: 2/90/-2/SROM/cephalic  FHT:  Baseline rate 140 bpm   Variability moderate  Accelerations present   Decelerations none Contractions: Every 3-4 mins   Pertinent Labs/Studies:   No results found for this or any previous visit (from the past 24 hour(s)).  Assessment : Julia Gay is a 35 y.o. G3P1010 at  [redacted]w[redacted]d being admitted for PPROM.  Plan: Labor: Expectant management.  Induction/Augmentation as needed, per protocol, alert SCN of impending premature female infant to deliver FWB: Reassuring fetal heart tracing.  GBS unknown.  Will treat with Ampicillin. Delivery plan: Hopeful for vaginal delivery  Melody Shambley, CNM [facility], [facility]

## 2015-12-02 NOTE — Anesthesia Preprocedure Evaluation (Signed)
Anesthesia Evaluation  Patient identified by MRN, date of birth, ID band Patient awake    Reviewed: Allergy & Precautions, H&P , NPO status , Patient's Chart, lab work & pertinent test results  History of Anesthesia Complications Negative for: history of anesthetic complications  Airway Mallampati: II  TM Distance: <3 FB Neck ROM: full    Dental no notable dental hx.    Pulmonary neg pulmonary ROS, former smoker,    Pulmonary exam normal        Cardiovascular negative cardio ROS Normal cardiovascular exam     Neuro/Psych negative neurological ROS     GI/Hepatic negative GI ROS, Neg liver ROS,   Endo/Other  negative endocrine ROS  Renal/GU negative Renal ROS  negative genitourinary   Musculoskeletal   Abdominal   Peds  Hematology negative hematology ROS (+)   Anesthesia Other Findings   Reproductive/Obstetrics (+) Pregnancy                             Anesthesia Physical Anesthesia Plan  ASA: II  Anesthesia Plan: Epidural   Post-op Pain Management:    Induction:   Airway Management Planned:   Additional Equipment:   Intra-op Plan:   Post-operative Plan:   Informed Consent: I have reviewed the patients History and Physical, chart, labs and discussed the procedure including the risks, benefits and alternatives for the proposed anesthesia with the patient or authorized representative who has indicated his/her understanding and acceptance.     Plan Discussed with: Anesthesiologist and CRNA  Anesthesia Plan Comments:         Anesthesia Quick Evaluation

## 2015-12-02 NOTE — Anesthesia Postprocedure Evaluation (Signed)
Anesthesia Post Note  Patient: Julia Gay  Procedure(s) Performed: * No procedures listed *  Patient location during evaluation: PACU Anesthesia Type: General Level of consciousness: awake and alert Pain management: pain level controlled Vital Signs Assessment: post-procedure vital signs reviewed and stable Respiratory status: spontaneous breathing, nonlabored ventilation, respiratory function stable and patient connected to nasal cannula oxygen Cardiovascular status: blood pressure returned to baseline and stable Postop Assessment: no signs of nausea or vomiting Anesthetic complications: no    Last Vitals:  Filed Vitals:   12/02/15 1613 12/02/15 1649  BP:  118/64  Pulse:  115  Temp: 37 C   Resp:      Last Pain:  Filed Vitals:   12/02/15 1651  PainSc: [name]

## 2015-12-02 NOTE — Progress Notes (Signed)
Julia Gay is a 35 y.o. G3P1010 at [redacted]w[redacted]d by LMP admitted for Preterm labor, PROM at 0430a-large gush of clear fluid.  Subjective: Denies pain with contractions  Objective: BP 106/91 mmHg  Pulse 107  Temp(Src) 98.1 F (36.7 C) (Oral)  Resp 20  Ht 5\' 4"  (1.626 m)  Wt 210 lb (95.255 kg)  BMI 36.03 kg/m2  LMP 03/29/2015 (Exact Date)      FHT:  FHR: 128 bpm, variability: moderate,  accelerations:  Present,  decelerations:  Absent UC:   irregular, every 5-9 minutes, mild- patient able to sleep through them SVE:   Dilation: 4 Effacement (%): 70 Station: -1 Exam by:: Megyn Leng  Labs: Lab Results  Component Value Date   WBC 14.1* 12/02/2015   HGB 11.2* 12/02/2015   HCT 33.1* 12/02/2015   MCV 84.4 12/02/2015   PLT 200 12/02/2015    Assessment / Plan: PPROM with mild irregular contractions  Labor: not progressing, will start pitocin per protocol Preeclampsia:  labs stable Fetal Wellbeing:  Category I Pain Control:  Labor support without medications I/D:  n/a Anticipated MOD:  NSVD  Labria Wos [employer], CNM 12/02/2015, 9:40 AM

## 2015-12-02 NOTE — OB Triage Note (Signed)
34yo G3P1 pt presents with ruptured membranes. [redacted]w[redacted]d gestation. +FM, no bleeding.

## 2015-12-02 NOTE — Progress Notes (Signed)
Julia Gay is a 35 y.o. G3P1010 at [redacted]w[redacted]d by LMP admitted for rupture of membranes  Subjective:   Objective: BP 118/64 mmHg  Pulse 115  Temp(Src) 98.6 F (37 C) (Oral)  Resp 19  Ht 5\' 4"  (1.626 m)  Wt 210 lb (95.255 kg)  BMI 36.03 kg/m2  SpO2 100%  LMP 03/29/2015 (Exact Date)      FHT:  FHR: 140 bpm, variability: moderate,  accelerations:  Present,  decelerations:  Absent UC:   irregular, every 2-3 minutes SVE:   Dilation: 10 Effacement (%): 70 Station: +1 Exam by:: shambley  Labs: Lab Results  Component Value Date   WBC 14.1* 12/02/2015   HGB 11.2* 12/02/2015   HCT 33.1* 12/02/2015   MCV 84.4 12/02/2015   PLT 200 12/02/2015    Assessment / Plan: Augmentation of labor, progressing well  Labor: entering 2nd stage Preeclampsia:  labs stable Fetal Wellbeing:  Category I Pain Control:  Epidural I/D:  n/a Anticipated MOD:  NSVD  Melody N Shambley,CNM 12/02/2015, 4:52 PM

## 2015-12-02 NOTE — Anesthesia Procedure Notes (Signed)
Epidural Patient location during procedure: OB Start time: 12/02/2015 1:45 PM End time: 12/02/2015 2:05 PM  Staffing Resident/CRNA: Akia Desroches Performed by: resident/CRNA   Preanesthetic Checklist Completed: patient identified, site marked, surgical consent, pre-op evaluation, timeout performed, IV checked, risks and benefits discussed and monitors and equipment checked  Epidural Patient position: sitting Prep: Betadine Patient monitoring: heart rate, continuous pulse ox and blood pressure Approach: midline Location: L3-L4 Injection technique: LOR air  Needle:  Needle type: Tuohy  Needle gauge: 17 G Needle length: 9 cm and 9 Catheter type: closed end flexible Catheter size: 20 Guage Catheter at skin depth: 12 cm Test dose: negative and 1.5% lidocaine with Epi 1:200 K  Assessment Sensory level: T10 Events: blood not aspirated, injection not painful, no injection resistance, negative IV test and no paresthesia  Additional Notes   Patient tolerated the insertion well without complications.Reason for block:procedure for pain

## 2015-12-03 ENCOUNTER — Encounter: Payer: Self-pay | Admitting: Family Medicine

## 2015-12-03 LAB — CBC
HEMATOCRIT: 30.2 % — AB (ref 35.0–47.0)
Hemoglobin: 10 g/dL — ABNORMAL LOW (ref 12.0–16.0)
MCH: 28.9 pg (ref 26.0–34.0)
MCHC: 33.3 g/dL (ref 32.0–36.0)
MCV: 86.7 fL (ref 80.0–100.0)
PLATELETS: 157 10*3/uL (ref 150–440)
RBC: 3.48 MIL/uL — ABNORMAL LOW (ref 3.80–5.20)
RDW: 13.7 % (ref 11.5–14.5)
WBC: 11.9 10*3/uL — ABNORMAL HIGH (ref 3.6–11.0)

## 2015-12-03 LAB — RPR: RPR Ser Ql: NONREACTIVE

## 2015-12-03 NOTE — Plan of Care (Signed)
Problem: Role Relationship: Goal: Ability to demonstrate positive interaction with newborn will improve Outcome: Completed/Met Date Met:  12/03/15 Infant in SCN but mom goes to visit frequently.

## 2015-12-03 NOTE — Progress Notes (Signed)
Post Partum Day 1 Subjective: up ad lib, voiding and tired  Objective: Blood pressure 121/62, pulse 83, temperature 97.9 F (36.6 C), temperature source Oral, resp. rate 18, height 5\' 4"  (1.626 m), weight 210 lb (95.255 kg), last menstrual period 03/29/2015, SpO2 99 %, unknown if currently breastfeeding.  Physical Exam:  General: alert, cooperative, appears stated age, mildly obese and pale Lochia: appropriate Uterine Fundus: firm Incision: NA DVT Evaluation: No evidence of DVT seen on physical exam. Negative Homan's sign.   Recent Labs  12/02/15 0748 12/03/15 0556  HGB 11.2* 10.0*  HCT 33.1* 30.2*    Assessment/Plan: Breastfeeding, in SCN due to cyanotic episode during the night, is stable Infant feeding pumping for Breast; Bottle; Both    LOS: 1 day   [employer], CNM 12/03/2015, 8:08 AM

## 2015-12-03 NOTE — Progress Notes (Signed)
I have verified all charting done by Gaspar Garbe (student RN). Alecia Lemming RN

## 2015-12-04 NOTE — Discharge Summary (Signed)
Discharge instructions reviewed with patient including need to schedule 6 week follow up appointment, activity limitations, postpartum care following vaginal delivery and when to seek medical attention. All questions answered.

## 2015-12-04 NOTE — Discharge Summary (Signed)
Obstetric Discharge Summary Reason for Admission: rupture of membranes Prenatal Procedures: ultrasound Intrapartum Procedures: spontaneous vaginal delivery Postpartum Procedures: Rubella Ig and varicella vaccine Complications-Operative and Postpartum: none HEMOGLOBIN  Date Value Ref Range Status  12/03/2015 10.0* 12.0 - 16.0 g/dL Final   HGB  Date Value Ref Range Status  06/01/2012 12.7 12.0-16.0 g/dL Final   HCT  Date Value Ref Range Status  12/03/2015 30.2* 35.0 - 47.0 % Final  06/01/2012 38.0 35.0-47.0 % Final   HEMATOCRIT  Date Value Ref Range Status  10/09/2015 33.2* 34.0 - 46.6 % Final    Physical Exam:  General: alert, cooperative, appears stated age, mildly obese and pale Lochia: appropriate Uterine Fundus: firm Incision: NA DVT Evaluation: No evidence of DVT seen on physical exam. Negative Homan's sign.  Discharge Diagnoses: Premature labor and PROM x12 hours  Discharge Information: Date: 12/04/2015 Activity: pelvic rest Diet: routine Medications: PNV, Ibuprofen, Colace, Iron and vitamin d & B12 Condition: stable Instructions: refer to practice specific booklet Discharge to: home, plans withdrawal method    Newborn Data: Live born female Waylan Atticus Birth Weight: 7 lb 7.9 oz (3400 g) APGAR: 5, 7  Infant will remain in SCN for sepsis observation.  Jaye Saal N Ines Rebel,CNM 12/04/2015, 8:06 AM

## 2015-12-04 NOTE — Progress Notes (Signed)
I have verified all charting done by Gaspar Garbe (student RN). Alecia Lemming RN

## 2015-12-05 ENCOUNTER — Encounter: Payer: Medicaid Other | Admitting: Obstetrics and Gynecology

## 2015-12-12 ENCOUNTER — Encounter: Payer: Medicaid Other | Admitting: Obstetrics and Gynecology

## 2015-12-16 ENCOUNTER — Other Ambulatory Visit: Payer: Self-pay | Admitting: *Deleted

## 2015-12-16 MED ORDER — METHYLERGONOVINE MALEATE 0.2 MG PO TABS: 0.2000 mg | ORAL_TABLET | Freq: Four times a day (QID) | ORAL | Status: DC

## 2015-12-17 ENCOUNTER — Encounter
Admission: RE | Disposition: A | Discharge status: Home / Self Care | Payer: Self-pay | Source: Ambulatory Visit | Attending: Obstetrics and Gynecology

## 2015-12-17 ENCOUNTER — Other Ambulatory Visit: Payer: Medicaid Other

## 2015-12-17 ENCOUNTER — Ambulatory Visit (INDEPENDENT_AMBULATORY_CARE_PROVIDER_SITE_OTHER): Payer: Medicaid Other

## 2015-12-17 ENCOUNTER — Ambulatory Visit: Payer: Medicaid Other | Admitting: Anesthesiology

## 2015-12-17 ENCOUNTER — Other Ambulatory Visit: Payer: Self-pay | Admitting: Obstetrics and Gynecology

## 2015-12-17 ENCOUNTER — Ambulatory Visit
Admission: RE | Admit: 2015-12-17 | Discharge: 2015-12-17 | Disposition: A | Discharge status: Home / Self Care | Payer: Medicaid Other | Source: Ambulatory Visit | Attending: Obstetrics and Gynecology | Admitting: Obstetrics and Gynecology

## 2015-12-17 ENCOUNTER — Ambulatory Visit (INDEPENDENT_AMBULATORY_CARE_PROVIDER_SITE_OTHER): Payer: Medicaid Other | Admitting: Obstetrics and Gynecology

## 2015-12-17 ENCOUNTER — Encounter: Payer: Self-pay | Admitting: Anesthesiology

## 2015-12-17 DIAGNOSIS — Z87891 Personal history of nicotine dependence: Secondary | ICD-10-CM | POA: Insufficient documentation

## 2015-12-17 DIAGNOSIS — Z832 Family history of diseases of the blood and blood-forming organs and certain disorders involving the immune mechanism: Secondary | ICD-10-CM | POA: Insufficient documentation

## 2015-12-17 DIAGNOSIS — J301 Allergic rhinitis due to pollen: Secondary | ICD-10-CM | POA: Diagnosis not present

## 2015-12-17 DIAGNOSIS — Z823 Family history of stroke: Secondary | ICD-10-CM | POA: Diagnosis not present

## 2015-12-17 DIAGNOSIS — Z811 Family history of alcohol abuse and dependence: Secondary | ICD-10-CM | POA: Diagnosis not present

## 2015-12-17 DIAGNOSIS — K219 Gastro-esophageal reflux disease without esophagitis: Secondary | ICD-10-CM | POA: Insufficient documentation

## 2015-12-17 DIAGNOSIS — N939 Abnormal uterine and vaginal bleeding, unspecified: Secondary | ICD-10-CM

## 2015-12-17 DIAGNOSIS — Z8249 Family history of ischemic heart disease and other diseases of the circulatory system: Secondary | ICD-10-CM | POA: Insufficient documentation

## 2015-12-17 HISTORY — PX: DILATION AND CURETTAGE OF UTERUS: SHX78

## 2015-12-17 LAB — CBC WITH DIFFERENTIAL/PLATELET
BASOS ABS: 0 10*3/uL (ref 0–0.1)
Basophils Relative: 0 %
EOS ABS: 0.1 10*3/uL (ref 0–0.7)
EOS PCT: 1 %
HCT: 33.6 % — ABNORMAL LOW (ref 35.0–47.0)
Hemoglobin: 11.4 g/dL — ABNORMAL LOW (ref 12.0–16.0)
LYMPHS PCT: 17 %
Lymphs Abs: 1.7 10*3/uL (ref 1.0–3.6)
MCH: 28.3 pg (ref 26.0–34.0)
MCHC: 33.9 g/dL (ref 32.0–36.0)
MCV: 83.7 fL (ref 80.0–100.0)
MONO ABS: 0.3 10*3/uL (ref 0.2–0.9)
Monocytes Relative: 3 %
Neutro Abs: 7.8 10*3/uL — ABNORMAL HIGH (ref 1.4–6.5)
Neutrophils Relative %: 79 %
PLATELETS: 254 10*3/uL (ref 150–440)
RBC: 4.01 MIL/uL (ref 3.80–5.20)
RDW: 13 % (ref 11.5–14.5)
WBC: 10 10*3/uL (ref 3.6–11.0)

## 2015-12-17 SURGERY — DILATION AND CURETTAGE
Anesthesia: General | Wound class: Clean Contaminated

## 2015-12-17 MED ORDER — PROPOFOL 10 MG/ML IV BOLUS
INTRAVENOUS | Status: DC | PRN
  Administered 2015-12-17: 160 mg via INTRAVENOUS

## 2015-12-17 MED ORDER — FENTANYL CITRATE (PF) 100 MCG/2ML IJ SOLN
INTRAMUSCULAR | Status: DC | PRN
  Administered 2015-12-17: 100 ug via INTRAVENOUS

## 2015-12-17 MED ORDER — IBUPROFEN 800 MG PO TABS: 800.0000 mg | ORAL_TABLET | Freq: Three times a day (TID) | ORAL | Status: DC

## 2015-12-17 MED ORDER — HYDROCODONE-ACETAMINOPHEN 5-300 MG PO TABS: 1.0000 | ORAL_TABLET | Freq: Every day | ORAL | Status: DC

## 2015-12-17 MED ORDER — METHYLERGONOVINE MALEATE 0.2 MG/ML IJ SOLN
INTRAMUSCULAR | Status: DC | PRN
  Administered 2015-12-17: 0.2 mg via INTRAMUSCULAR

## 2015-12-17 MED ORDER — EPHEDRINE SULFATE 50 MG/ML IJ SOLN
INTRAMUSCULAR | Status: DC | PRN
  Administered 2015-12-17 (×2): 5 mg via INTRAVENOUS

## 2015-12-17 MED ORDER — FENTANYL CITRATE (PF) 100 MCG/2ML IJ SOLN
25.0000 ug | INTRAMUSCULAR | Status: DC | PRN
  Administered 2015-12-17 (×2): 25 ug via INTRAVENOUS

## 2015-12-17 MED ORDER — DEXAMETHASONE SODIUM PHOSPHATE 10 MG/ML IJ SOLN
INTRAMUSCULAR | Status: DC | PRN
  Administered 2015-12-17: 10 mg via INTRAVENOUS

## 2015-12-17 MED ORDER — METHYLERGONOVINE MALEATE 0.2 MG/ML IJ SOLN
INTRAMUSCULAR | Status: AC
  Filled 2015-12-17: qty 1

## 2015-12-17 MED ORDER — LACTATED RINGERS IV SOLN
INTRAVENOUS | Status: DC
  Administered 2015-12-17: 12:00:00 via INTRAVENOUS

## 2015-12-17 MED ORDER — ONDANSETRON HCL 4 MG/2ML IJ SOLN: 4.0000 mg | Freq: Once | INTRAMUSCULAR | Status: DC | PRN

## 2015-12-17 MED ORDER — SUCCINYLCHOLINE CHLORIDE 20 MG/ML IJ SOLN
INTRAMUSCULAR | Status: DC | PRN
  Administered 2015-12-17: 100 mg via INTRAVENOUS

## 2015-12-17 MED ORDER — CEFAZOLIN SODIUM-DEXTROSE 2-4 GM/100ML-% IV SOLN
2.0000 g | Freq: Once | INTRAVENOUS | Status: AC
  Administered 2015-12-17: 2 g via INTRAVENOUS

## 2015-12-17 MED ORDER — CEFAZOLIN SODIUM-DEXTROSE 2-4 GM/100ML-% IV SOLN
INTRAVENOUS | Status: AC
  Filled 2015-12-17: qty 100

## 2015-12-17 MED ORDER — LIDOCAINE HCL (CARDIAC) 20 MG/ML IV SOLN
INTRAVENOUS | Status: DC | PRN
  Administered 2015-12-17: 100 mg via INTRAVENOUS

## 2015-12-17 MED ORDER — FENTANYL CITRATE (PF) 100 MCG/2ML IJ SOLN
INTRAMUSCULAR | Status: AC
  Administered 2015-12-17: 25 ug via INTRAVENOUS
  Filled 2015-12-17: qty 2

## 2015-12-17 MED ORDER — METHYLERGONOVINE MALEATE 0.2 MG PO TABS: 0.2000 mg | ORAL_TABLET | Freq: Four times a day (QID) | ORAL | Status: AC

## 2015-12-17 MED ORDER — DEXTROSE 5 % IV SOLN: 2.0000 g | Freq: Once | INTRAVENOUS | Status: DC

## 2015-12-17 MED ORDER — ONDANSETRON HCL 4 MG/2ML IJ SOLN
INTRAMUSCULAR | Status: DC | PRN
  Administered 2015-12-17: 4 mg via INTRAVENOUS

## 2015-12-17 SURGICAL SUPPLY — 15 items
CATH ROBINSON RED A/P 16FR (CATHETERS) ×3 IMPLANT
DRSG TELFA 3X8 NADH (GAUZE/BANDAGES/DRESSINGS) ×3 IMPLANT
GLOVE BIO SURGEON STRL SZ8 (GLOVE) ×3 IMPLANT
GOWN STRL REUS W/ TWL LRG LVL3 (GOWN DISPOSABLE) ×1 IMPLANT
GOWN STRL REUS W/ TWL XL LVL3 (GOWN DISPOSABLE) ×1 IMPLANT
GOWN STRL REUS W/TWL LRG LVL3 (GOWN DISPOSABLE) ×3
GOWN STRL REUS W/TWL XL LVL3 (GOWN DISPOSABLE) ×3
KIT RM TURNOVER CYSTO AR (KITS) ×3 IMPLANT
NS IRRIG 1000ML POUR BTL (IV SOLUTION) ×3 IMPLANT
PACK DNC HYST (MISCELLANEOUS) ×3 IMPLANT
PAD DRESSING TELFA 3X8 NADH (GAUZE/BANDAGES/DRESSINGS) ×1 IMPLANT
PAD OB MATERNITY 4.3X12.25 (PERSONAL CARE ITEMS) ×3 IMPLANT
PAD PREP 24X41 OB/GYN DISP (PERSONAL CARE ITEMS) ×3 IMPLANT
SPONGE XRAY 4X4 16PLY STRL (MISCELLANEOUS) ×3 IMPLANT
TOWEL OR 17X26 4PK STRL BLUE (TOWEL DISPOSABLE) ×3 IMPLANT

## 2015-12-17 NOTE — Transfer of Care (Signed)
Immediate Anesthesia Transfer of Care Note  Patient: Julia Gay  Procedure(s) Performed: Procedure(s): DILATATION AND CURETTAGE (N/A)  Patient Location: PACU  Anesthesia Type:General  Level of Consciousness: awake, alert , oriented and patient cooperative  Airway & Oxygen Therapy: Patient Spontanous Breathing and Patient connected to nasal cannula oxygen  Post-op Assessment: Report given to RN, Post -op Vital signs reviewed and stable and Patient moving all extremities  Post vital signs: Reviewed and stable  Last Vitals:  Filed Vitals:   12/17/15 1146 12/17/15 1320  BP: 119/73 117/67  Pulse: 84 85  Temp: 36.8 C 36.7 C  Resp: 16 13    Complications: No apparent anesthesia complications

## 2015-12-17 NOTE — Anesthesia Preprocedure Evaluation (Addendum)
Anesthesia Evaluation  Patient identified by MRN, date of birth, ID band Patient awake    Reviewed: Allergy & Precautions, H&P , NPO status , Patient's Chart, lab work & pertinent test results  History of Anesthesia Complications Negative for: history of anesthetic complications  Airway Mallampati: III  TM Distance: >3 FB Neck ROM: full    Dental no notable dental hx.    Pulmonary neg pulmonary ROS, former smoker,    Pulmonary exam normal        Cardiovascular negative cardio ROS Normal cardiovascular exam     Neuro/Psych PSYCHIATRIC DISORDERS OC disordernegative neurological ROS     GI/Hepatic negative GI ROS, Neg liver ROS, GERD  ,Patient reports increasing reflux, especially in supine position   Endo/Other  negative endocrine ROS  Renal/GU negative Renal ROS  negative genitourinary   Musculoskeletal   Abdominal   Peds  Hematology negative hematology ROS (+)   Anesthesia Other Findings persitent vaginal bleeding   Reproductive/Obstetrics                            Anesthesia Physical  Anesthesia Plan  ASA: II  Anesthesia Plan: General   Post-op Pain Management:    Induction: Intravenous and Rapid sequence  Airway Management Planned: Oral ETT  Additional Equipment:   Intra-op Plan:   Post-operative Plan: Extubation in OR  Informed Consent: I have reviewed the patients History and Physical, chart, labs and discussed the procedure including the risks, benefits and alternatives for the proposed anesthesia with the patient or authorized representative who has indicated his/her understanding and acceptance.     Plan Discussed with: Anesthesiologist and CRNA  Anesthesia Plan Comments:        Anesthesia Quick Evaluation

## 2015-12-17 NOTE — Op Note (Addendum)
OPERATIVE NOTE:  Julia Gay PROCEDURE DATE: 12/17/2015   PREOPERATIVE DIAGNOSIS:  1. Abnormal uterine bleeding postpartum 2. Status post spontaneous vaginal delivery 2 weeks ago 3. Possible retained placenta  POSTOPERATIVE DIAGNOSIS: Same as above  ROCEDURE:  dilation and curettage of the endometrium SURGEON:  Brayton Mars, MD ASSISTANTS: None ANESTHESIA: General INDICATIONS: 35 y.o. [id number] 2 weeks status post spontaneous vaginal delivery presents with heavy vaginal bleeding. Ultrasound demonstrates probable retained placenta.  FINDINGS:   Bimanual exam demonstrates 18 week size boggy uterus; cervix open;m small amount of decidua/placenta removed.   I/O's: Total I/O In: -  Out: 250 [Urine:100; Blood:150]/placenta removed COUNTS:  YES SPECIMENS: Intrauterine tissue ANTIBIOTIC PROPHYLAXIS:Ancef 2 grams COMPLICATIONS: None immediate  PROCEDURE IN DETAIL:  Patient was brought to the operating room where she was placed in the supine position. General endotracheal anesthesia was induced without difficulty. She was placed in the dorsal lithotomy position using candycane stirrups. A Betadine perineal intravaginal prep and drape was performed in standard fashion. Ancef 2 g antibiotic prophylaxis had been given preoperatively. Bimanual exam demonstrated an enlarged uterus approximately 18 weeks' size, soft. Cervix was opened and no dilatation was applied. Single-tooth tenaculum was placed on the anterior lip of the cervix. Smooth and serrated curettes were used to perform the curettage with production of a small amount of decidual-like tissue. No large tissue fragments were removed. Methergine 0.2 mg IM was given to help as a  uterotonic agent in order to decrease blood loss. Following multiple episodes of curettage, Stone polyp forceps were used to try and extract any residual tissue. Once satisfied with adequate removal of tissue, the procedure was terminated with all  instrumentation being removed from the vagina. Patient was awakened, extubated, mobilized and taken to her room in satisfactory condition  Martin A. Zipporah Plants, MD, ACOG [facility]

## 2015-12-17 NOTE — H&P (Signed)
Subjective:  PREOPERATIVE HISTORY AND PHYSICAL     Julia Gay is a 35 y.o. G3P1153female scheduled forDilatation and curettage of the endometrium for suspected retained products of conception (placenta) 2 weeks status post vaginal delivery. Julia Gay has had chronic intermittent heavy bleeding. No fever or chills/sweats.   ultrasound today demonstrates a thickened hypervascular endometrium measuring 6.04 x 3.90 x 5.85 cm.   Discusse she does notd Blood/Blood Products: yes   Menstrual History: OB History    Gravida Para Term Preterm AB TAB SAB Ectopic Multiple Living   3 2 1 1 1  1   0 1      Menarche age: Not applicable No LMP recorded.    Past Medical History  Diagnosis Date  . Hay fever   . History of UTI   . History of HPV infection     Past Surgical History  Procedure Laterality Date  . Leep  2006    OB History  Gravida Para Term Preterm AB SAB TAB Ectopic Multiple Living  3 2 1 1 1 1    0 1    # Outcome Date GA Lbr Len/2nd Weight Sex Delivery Anes PTL Lv  3 Preterm 12/02/15 [redacted]w[redacted]d / 00:33 7 lb 7.9 oz (3.4 kg) M Vag-Spont EPI  Y  2 SAB 12/2014        FD  1 Term 01/17/02 [redacted]w[redacted]d  7 lb 6 oz (3.345 kg) M Vag-Spont  N      Complications: Preeclampsia complicating hypertension      Social History   Social History  . Marital Status: Married    Spouse Name: N/A  . Number of Children: N/A  . Years of Education: N/A   Social History Main Topics  . Smoking status: Former Research scientist (life sciences)  . Smokeless tobacco: Never Used     Comment: quit in 2004  . Alcohol Use: 0.0 oz/week    0 Standard drinks or equivalent per week     Comment: socially  . Drug Use: No  . Sexual Activity: Yes    Birth Control/ Protection: None   Other Topics Concern  . Not on file   Social History Narrative   Married.    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   Has 1 child.   Enjoys playing soccer and tennis, singing, watching TV.       Family History  Problem Relation Age of Onset  .  Alcohol abuse Father     quit when pt was a child  . Alcohol abuse Other     both grandparents  . Heart disease Father   . Stroke Father   . Hypertension Father   . Anemia Mother   . Aplastic anemia Father      (Not in a hospital admission)  No Known Allergies  Review of Systems Constitutional: No recent fever/chills/sweats Respiratory: No recent cough/bronchitis Cardiovascular: No chest pain Gastrointestinal: No recent nausea/vomiting/diarrhea Genitourinary: No UTI symptoms Hematologic/lymphatic:No history of coagulopathy or recent blood thinner use    Objective:    There were no vitals taken for this visit.  General:   Normal  Skin:   normal  HEENT:  Normal  Neck:  Supple without Adenopathy or Thyromegaly  Lungs:   Heart:              Breasts:   Abdomen:  Pelvis:  M/S   Extremeties:  Neuro:    clear to auscultation bilaterally   Normal without murmur   Not Examined   soft, non-tender; bowel  sounds normal; no masses,  no organomegaly   Exam deferred to OR  No CVAT  Warm/Dry   Normal          Assessment:    1. Retained placenta 2 weeks status post vaginal delivery 2. Heavy vaginal bleeding    Plan:  Dilation and curettage of endometrium   Preop counseling: The Julia Gay is to undergo dilation and curettage of the endometrium for retained placenta 2 weeks status post vaginal delivery. She is understanding of the planned procedure and is aware of and is accepting of all surgical risks which include but are not limited to bleeding, infection, pelvic organ injury with need for repair, blood clot disorders, anesthesia risks, etc. All questions have been answered. Informed consent is given. Julia Gay is ready and willing to proceed with surgery as scheduled.   Brayton Mars, MD  Note: This dictation was prepared with Dragon dictation along with smaller phrase technology. Any transcriptional errors that result from this process are unintentional.

## 2015-12-17 NOTE — Anesthesia Postprocedure Evaluation (Signed)
Anesthesia Post Note  Patient: Julia Gay  Procedure(s) Performed: Procedure(s) (LRB): DILATATION AND CURETTAGE (N/A)  Patient location during evaluation: PACU Anesthesia Type: General Level of consciousness: awake and alert and oriented Pain management: pain level controlled Vital Signs Assessment: post-procedure vital signs reviewed and stable Respiratory status: spontaneous breathing Cardiovascular status: blood pressure returned to baseline Anesthetic complications: no    Last Vitals:  Filed Vitals:   12/17/15 1415 12/17/15 1421  BP: 117/70 124/76  Pulse: 80 88  Temp: 36.5 C   Resp: 16 16    Last Pain:  Filed Vitals:   12/17/15 1427  PainSc: 5                  Zaydin Billey

## 2015-12-17 NOTE — Discharge Instructions (Signed)

## 2015-12-17 NOTE — Progress Notes (Signed)
Chief complaint: 1. Heavy vaginal bleeding postpartum  Patient presents today for dilation and curettage of endometrium for retained placenta. She delivered 2 weeks ago and has had Assessment heavy vaginal bleeding. Ultrasound has demonstrated probable retained placenta with the endometrium measuring 6 cm x 5.8 cm x 4.8 cm with hypervascular flow.  Preoperative H&P is completed for surgery later today. (See separate note)  ASSESSMENT: 1. Retained placenta 2. Heavy vaginal bleeding  PLAN: Dilation and curettage of endometrium today.  Brayton Mars, MD

## 2015-12-17 NOTE — Progress Notes (Unsigned)
Subjective:     Patient ID: Julia Gay, female   DOB: 1981/01/14, 35 y.o.   MRN: [id number]  HPI Notified me yesterday of passing of large clot early am, with trickle of bleeding on/off afterwards, then passed two more clots in the afternoon, followed by another one this morning, with now moderate bleeding and mild cramps. Denies fever. Feels fatigued. Is day 15 postpartum PPROM and PTD at 17w5dof well female infant without initial postpartum complications. Review of Systems See above    Objective:   Physical Exam A&O x4  well groomed female, fatigued, tearful Temp 98.6, pulse 100, BP 122/73 HRR, Lungs clear bilaterally Abdomen soft and slightly tender above pubis  Ultrasound reveals: The uterus measures 12.6 x 8.2 x 9.9cm. Echo texture is homogenous without evidence of focal masses within myometrium. The Endometrium is obscured by a hyperechoic hypervascular mass highly suspicious for retained placental tissue versus clot.  Mass measures 6 x 3.9 x 5.8cm.    Right Ovary measures 3 x 1.8 x 2 cm. It is normal in appearance. Left Ovary measures 2.9 x 1.7 x 2.4 cm. It is normal appearance. Survey of the adnexa demonstrates no adnexal masses. There is no free fluid in the cul de sac.  Impression: 1. Highly suspicious for retained placenta versus clot    Assessment:     Postpartum bleeding secondary to retained placental fragments, day 15  Anemia  Vitamin D deficiency Vegetarian Obesity       Plan:     Discussed findings with patient and spouse Recommended D&C- Dr DOrlene Plumwill take over from here.   Julia Gay S[place]armwood CNM

## 2015-12-17 NOTE — Anesthesia Procedure Notes (Signed)
Procedure Name: Intubation Date/Time: 12/17/2015 12:40 PM Performed by: Alda Berthold Pre-anesthesia Checklist: Patient identified, Patient being monitored, Timeout performed, Emergency Drugs available and Suction available Patient Re-evaluated:Patient Re-evaluated prior to inductionOxygen Delivery Method: Circle system utilized Preoxygenation: Pre-oxygenation with 100% oxygen Intubation Type: IV induction, Cricoid Pressure applied and Rapid sequence Ventilation: Mask ventilation without difficulty Laryngoscope Size: Mac and 3 Grade View: Grade II Tube type: Oral Tube size: 6.0 mm Number of attempts: 1 Placement Confirmation: ETT inserted through vocal cords under direct vision,  positive ETCO2 and breath sounds checked- equal and bilateral Secured at: 20 cm Tube secured with: Tape Dental Injury: Teeth and Oropharynx as per pre-operative assessment

## 2015-12-19 ENCOUNTER — Encounter: Payer: Medicaid Other | Admitting: Obstetrics and Gynecology

## 2015-12-19 LAB — SURGICAL PATHOLOGY

## 2015-12-24 ENCOUNTER — Encounter: Payer: Self-pay | Admitting: Obstetrics and Gynecology

## 2015-12-24 ENCOUNTER — Ambulatory Visit (INDEPENDENT_AMBULATORY_CARE_PROVIDER_SITE_OTHER): Payer: Medicaid Other | Admitting: Obstetrics and Gynecology

## 2015-12-24 VITALS — BP 107/77 | HR 111 | Ht 64.0 in | Wt 191.9 lb

## 2015-12-24 DIAGNOSIS — D649 Anemia, unspecified: Secondary | ICD-10-CM

## 2015-12-24 DIAGNOSIS — Z09 Encounter for follow-up examination after completed treatment for conditions other than malignant neoplasm: Secondary | ICD-10-CM

## 2015-12-24 MED ORDER — METHYLERGONOVINE MALEATE 0.2 MG PO TABS: 0.2000 mg | ORAL_TABLET | Freq: Four times a day (QID) | ORAL | Status: DC

## 2015-12-24 NOTE — Patient Instructions (Signed)
1. Methergine 0 point every 6 hours for 4 doses 2. Continue with iron supplementation twice a day and Colace twice a day 3. Return if heavy bleeding persists 4. CBC is obtained today.

## 2015-12-24 NOTE — Progress Notes (Signed)
Chief complaint: 1. One week postop check 2. Status post D&C for delayed postpartum hemorrhage and retained placenta  Patient has had intermittent bleeding with passage of clots since delivery and since D&C 1 week ago. She is feeling lightheaded today. Photo of large clot passed last night was reviewed. Patient denies fevers chills or sweats. She is having issues with some constipation. She is taking iron twice a day. She is not breast-feeding.  OBJECTIVE: BP 107/77 mmHg  Pulse 111  Ht 5\' 4"  (1.626 m)  Wt 191 lb 14.4 oz (87.045 kg)  BMI 32.92 kg/m2  Breastfeeding? No Pleasant white female in no acute distress. Capillary refill-normal Abdomen: Soft, nontender without organomegaly Pelvic exam: External genitalia normal BUS-normal Vagina-small amount of burgundy blood in the vault without active bright red bleeding Cervix-no tears; no cervical motion tenderness Uterus-14 weeks size, soft, nontender Adnexa-nonpalpable nontender  ASSESSMENT: 1 week status post D&C for delayed postpartum hemorrhage with pathology demonstrating decidua with chorionic villi and myometrial fragments 2. Still with mild bleeding 3. Normal blood pressure  PLAN: 1. CBC 2. Continue iron supplementation and Colace twice a day 3. Methergine 0.2 mg by mouth 4 doses every 6 hours 4. Return if heavy bleeding persists  Brayton Mars, MD   Note: This dictation was prepared with Dragon dictation along with smaller phrase technology. Any transcriptional errors that result from this process are unintentional.

## 2015-12-25 LAB — CBC WITH DIFFERENTIAL/PLATELET
BASOS: 0 %
Basophils Absolute: 0 10*3/uL (ref 0.0–0.2)
EOS (ABSOLUTE): 0.2 10*3/uL (ref 0.0–0.4)
EOS: 3 %
HEMATOCRIT: 33.5 % — AB (ref 34.0–46.6)
Hemoglobin: 10.8 g/dL — ABNORMAL LOW (ref 11.1–15.9)
IMMATURE GRANS (ABS): 0 10*3/uL (ref 0.0–0.1)
IMMATURE GRANULOCYTES: 0 %
LYMPHS: 25 %
Lymphocytes Absolute: 1.9 10*3/uL (ref 0.7–3.1)
MCH: 27.8 pg (ref 26.6–33.0)
MCHC: 32.2 g/dL (ref 31.5–35.7)
MCV: 86 fL (ref 79–97)
MONOCYTES: 4 %
Monocytes Absolute: 0.3 10*3/uL (ref 0.1–0.9)
NEUTROS PCT: 68 %
Neutrophils Absolute: 5 10*3/uL (ref 1.4–7.0)
Platelets: 321 10*3/uL (ref 150–379)
RBC: 3.89 x10E6/uL (ref 3.77–5.28)
RDW: 13.6 % (ref 12.3–15.4)
WBC: 7.4 10*3/uL (ref 3.4–10.8)

## 2015-12-26 ENCOUNTER — Encounter: Payer: Medicaid Other | Admitting: Obstetrics and Gynecology

## 2015-12-27 ENCOUNTER — Other Ambulatory Visit: Payer: Self-pay | Admitting: Obstetrics and Gynecology

## 2015-12-27 ENCOUNTER — Telehealth: Payer: Self-pay | Admitting: *Deleted

## 2015-12-27 MED ORDER — DESOGESTREL-ETHINYL ESTRADIOL 0.15-0.02/0.01 MG (21/5) PO TABS: 1.0000 | ORAL_TABLET | Freq: Every day | ORAL | Status: DC

## 2015-12-27 NOTE — Telephone Encounter (Signed)
Pt is still having some bleeding, would like to discuss what to do???

## 2016-01-13 ENCOUNTER — Ambulatory Visit (INDEPENDENT_AMBULATORY_CARE_PROVIDER_SITE_OTHER): Payer: Medicaid Other | Admitting: Primary Care

## 2016-01-13 ENCOUNTER — Encounter: Payer: Self-pay | Admitting: Primary Care

## 2016-01-13 VITALS — BP 110/70 | HR 69 | Temp 98.2°F | Ht 64.0 in | Wt 194.1 lb

## 2016-01-13 DIAGNOSIS — R1084 Generalized abdominal pain: Secondary | ICD-10-CM | POA: Diagnosis not present

## 2016-01-13 NOTE — Progress Notes (Signed)
Pre visit review using our clinic review tool, if applicable. No additional management support is needed unless otherwise documented below in the visit note. 

## 2016-01-13 NOTE — Patient Instructions (Signed)
Please have your blood counts checked at your upcoming GYN visit this Thursday.  Ensure you are staying hydrated with water and consuming 25-30 grams of fiber daily to help soften bowels and prevent constipation.  Please notify me if your abdominal discomfort continues.  It was a pleasure to see you today!

## 2016-01-13 NOTE — Progress Notes (Signed)
Subjective:    Patient ID: Julia Gay, female    DOB: [birth year], 35 y.o.   MRN: [id number]  HPI  Julia Gay is a 35 year old female who presents today with a chief complaint of abdominal pain. She is 6 weeks post partum. She underwent a D&C 1 month ago for retained Placenta. She's experiencing abdominal pain that has been present for the past 2-3 days. She's continued to bleed since her D&C.   Her pain is located around the periumbilical region mostly but also generalized to RUQ, LUQ. Denies nausea, rectal bleeding, abdominal bloating, and vomiting. She is experiencing bowel movements every three days which are firm. She is on iron supplementation for slight anemia.   Review of Systems  Constitutional: Negative for fatigue.  Respiratory: Negative for shortness of breath.   Cardiovascular: Negative for chest pain.  Gastrointestinal: Positive for constipation. Negative for abdominal pain, diarrhea and anal bleeding.  Neurological: Negative for dizziness.       Past Medical History  Diagnosis Date  . Hay fever   . History of UTI   . History of HPV infection      Social History   Social History  . Marital Status: Married    Spouse Name: N/A  . Number of Children: N/A  . Years of Education: N/A   Occupational History  . Not on file.   Social History Main Topics  . Smoking status: Former Research scientist (life sciences)  . Smokeless tobacco: Never Used     Comment: quit in 2004  . Alcohol Use: 0.0 oz/week    0 Standard drinks or equivalent per week     Comment: socially  . Drug Use: No  . Sexual Activity: Yes    Birth Control/ Protection: None   Other Topics Concern  . Not on file   Social History Narrative   Married.    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   Has 1 child.   Enjoys playing soccer and tennis, singing, watching TV.       Past Surgical History  Procedure Laterality Date  . [place]  2006  . Dilation and curettage of uterus N/A 12/17/2015    Procedure:  DILATATION AND CURETTAGE;  Surgeon: Brayton Mars, MD;  Location: [facility] ORS;  Service: Gynecology;  Laterality: N/A;    Family History  Problem Relation Age of Onset  . Alcohol abuse Father     quit when pt was a child  . Alcohol abuse Other     both grandparents  . Heart disease Father   . Stroke Father   . Hypertension Father   . Anemia Mother   . Aplastic anemia Father     No Known Allergies  Current Outpatient Prescriptions on File Prior to Visit  Medication Sig Dispense Refill  . desogestrel-ethinyl estradiol (MIRCETTE) 0.15-0.02/0.01 MG (21/5) tablet Take 1 tablet by mouth daily. 2 Package 1  . ibuprofen (ADVIL,MOTRIN) 800 MG tablet Take 1 tablet (800 mg total) by mouth 3 (three) times daily. 30 tablet 0  . Iron-FA-B Cmp-C-Biot-Probiotic (FUSION PLUS) CAPS Take 1 capsule by mouth daily. 60 capsule 1  . methylergonovine (METHERGINE) 0.2 MG tablet Take 1 tablet (0.2 mg total) by mouth every 6 (six) hours. 4 tablet 0  . vitamin B-12 (CYANOCOBALAMIN) 100 MCG tablet Take 100 mcg by mouth daily.    . Vitamin D, Ergocalciferol, (DRISDOL) 50000 units CAPS capsule Take 1 capsule (50,000 Units total) by mouth 2 (two) times a week. 30 capsule  2  . sertraline (ZOLOFT) 50 MG tablet Take 1 tablet by mouth daily. Reported on 01/13/2016     No current facility-administered medications on file prior to visit.    BP 110/70 mmHg  Pulse 69  Temp(Src) 98.2 F (36.8 C) (Oral)  Ht 5\' 4"  (1.626 m)  Wt 194 lb 1.9 oz (88.052 kg)  BMI 33.30 kg/m2  SpO2 98%     Objective:   Physical Exam  Constitutional: She appears well-nourished.  Cardiovascular: Normal rate and regular rhythm.   Pulmonary/Chest: Effort normal and breath sounds normal.  Abdominal: Soft. Bowel sounds are normal. There is generalized tenderness.  Skin: Skin is warm and dry.          Assessment & Plan:  Abdominal Pain:  Located to general abdomen x 2-3 days, pain is achy, occasionally sharp. Exam today with  mild tenderness upon palpation to general abdomen. No Murphy's sign, rebound tenderness, bloating noted, alarm signs. Discussed numerous reasons for pain, could likely be constipation. She is worried regarding her continued vaginal bleeding s/p D&C 4 weeks ago. She is to follow up with GYN later this week. Recommended repeat CBC and CMP today, she declines as she will obtain at GYN office later this week.  Discussed to increase water and fiber consumption. She is to notify me if pain becomes worse.

## 2016-01-16 ENCOUNTER — Ambulatory Visit (INDEPENDENT_AMBULATORY_CARE_PROVIDER_SITE_OTHER): Payer: Medicaid Other | Admitting: Obstetrics and Gynecology

## 2016-01-16 ENCOUNTER — Encounter: Payer: Self-pay | Admitting: Obstetrics and Gynecology

## 2016-01-16 DIAGNOSIS — E669 Obesity, unspecified: Secondary | ICD-10-CM

## 2016-01-16 DIAGNOSIS — O9081 Anemia of the puerperium: Secondary | ICD-10-CM

## 2016-01-16 NOTE — Patient Instructions (Signed)
  Place postpartum visit patient instructions here.  

## 2016-01-16 NOTE — Progress Notes (Signed)
  Subjective:     Julia Gay is a 35 y.o. female who presents for a postpartum visit. She is 8 weeks postpartum following a spontaneous vaginal delivery. I have fully reviewed the prenatal and intrapartum course. The delivery was at 35 gestational weeks. Outcome: spontaneous vaginal delivery. Anesthesia: epidural. Postpartum course has been complicated by retained placental products and D&C. Baby's course has been uncomplicated. Baby is feeding by formula since 64 weeks of age. Bleeding staining only and brown. Bowel function is normal. Bladder function is normal. Patient is not sexually active. Contraception method is condoms and OCP (estrogen/progesterone). Postpartum depression screening: negative.  The following portions of the patient's history were reviewed and updated as appropriate: allergies, current medications, past family history, past medical history, past social history, past surgical history and problem list.  Review of Systems Pertinent items noted in HPI and remainder of comprehensive ROS otherwise negative.   Objective:    BP 122/78 mmHg  Pulse 78  Ht 5\' 4"  (1.626 m)  Wt 194 lb 12.8 oz (88.361 kg)  BMI 33.42 kg/m2  Breastfeeding? No  General:  alert, cooperative, appears stated age and moderately obese   Breasts:  inspection negative, no nipple discharge or bleeding, no masses or nodularity palpable  Lungs: clear to auscultation bilaterally  Heart:  regular rate and rhythm, S1, S2 normal, no murmur, click, rub or gallop  Abdomen: soft, non-tender; bowel sounds normal; no masses,  no organomegaly   Vulva:  normal  Vagina: normal vagina, no discharge, exudate, lesion, or erythema  Cervix:  multiparous appearance  Corpus: normal size, contour, position, consistency, mobility, non-tender  Adnexa:  normal adnexa and no mass, fullness, tenderness  Rectal Exam: Not performed.        Assessment:     8 weeks postpartum exam. Pap smear not done at today's visit.   Plan:     1. Contraception: condoms and OCP (estrogen/progesterone) 2. obesity 3. Follow up in: 4 months for AE or as needed.

## 2016-01-17 LAB — THYROID PANEL WITH TSH
FREE THYROXINE INDEX: 2.1 (ref 1.2–4.9)
T3 UPTAKE RATIO: 18 % — AB (ref 24–39)
T4 TOTAL: 11.6 ug/dL (ref 4.5–12.0)
TSH: 2.63 u[IU]/mL (ref 0.450–4.500)

## 2016-01-17 LAB — IRON: Iron: 63 ug/dL (ref 27–159)

## 2016-01-17 LAB — CBC
Hematocrit: 34.5 % (ref 34.0–46.6)
Hemoglobin: 11.1 g/dL (ref 11.1–15.9)
MCH: 27.6 pg (ref 26.6–33.0)
MCHC: 32.2 g/dL (ref 31.5–35.7)
MCV: 86 fL (ref 79–97)
PLATELETS: 256 10*3/uL (ref 150–379)
RBC: 4.02 x10E6/uL (ref 3.77–5.28)
RDW: 13.6 % (ref 12.3–15.4)
WBC: 7.1 10*3/uL (ref 3.4–10.8)

## 2016-01-17 LAB — VITAMIN D 25 HYDROXY (VIT D DEFICIENCY, FRACTURES): Vit D, 25-Hydroxy: 51.5 ng/mL (ref 30.0–100.0)

## 2016-01-29 ENCOUNTER — Encounter: Payer: Self-pay | Admitting: Primary Care

## 2016-01-30 ENCOUNTER — Telehealth: Payer: Self-pay | Admitting: Obstetrics and Gynecology

## 2016-01-30 NOTE — Telephone Encounter (Signed)
Pt is s/p svd 3/27. S/p D&c d/t RPOC on 4/11. After d&c had heavy bleeding. Started ocp(mircette) on 4/21. Bleeding has slowed down but picks up at times. She is changing her pad twice a day for hygiene. Several nites ago she had bad cramps and chills. NO meds tried. Pos foul odor x 2 weeks. Advised pt needs to be seen. Call transferred to [facility] to make appt.

## 2016-01-30 NOTE — Telephone Encounter (Signed)
PT IS STILL BLEEDING SINCE BIRTH OF BABY, SHE HAS TAKEN BC PILLS FOR 2 MONTHS AND BLEED STRAIGHT THRU. FOR THE PAST THREE DAYS SHE HAS HAD BAD CRAMPING SCALE 7-8 AND HAD CHILLS.

## 2016-02-04 ENCOUNTER — Ambulatory Visit (INDEPENDENT_AMBULATORY_CARE_PROVIDER_SITE_OTHER): Payer: Medicaid Other | Admitting: Obstetrics and Gynecology

## 2016-02-04 ENCOUNTER — Ambulatory Visit (INDEPENDENT_AMBULATORY_CARE_PROVIDER_SITE_OTHER): Payer: Medicaid Other

## 2016-02-04 ENCOUNTER — Encounter: Payer: Self-pay | Admitting: Obstetrics and Gynecology

## 2016-02-04 ENCOUNTER — Telehealth: Payer: Self-pay | Admitting: Obstetrics and Gynecology

## 2016-02-04 VITALS — BP 132/76 | HR 99 | Ht 64.0 in | Wt 194.3 lb

## 2016-02-04 DIAGNOSIS — N939 Abnormal uterine and vaginal bleeding, unspecified: Secondary | ICD-10-CM

## 2016-02-04 MED ORDER — METHYLERGONOVINE MALEATE 0.2 MG PO TABS: 0.2000 mg | ORAL_TABLET | Freq: Four times a day (QID) | ORAL | Status: DC

## 2016-02-04 MED ORDER — DOXYCYCLINE HYCLATE 100 MG PO CAPS: 100.0000 mg | ORAL_CAPSULE | Freq: Two times a day (BID) | ORAL | Status: DC

## 2016-02-04 NOTE — Patient Instructions (Signed)
1. Doxycycline 100 mg twice a day for 7 days 2. Methergine 0.2 mg orally every 6 hours for 4 doses 3. Continue with birth control pills daily 4. Ultrasound is scheduled today to assess the uterine lining 5. Return in 1 week for follow-up

## 2016-02-04 NOTE — Telephone Encounter (Signed)
The antibiotic that dr de sent for [facility] will not be available until tomorrow, does she need it sent somewhere else or is it ok to wait ill tomorrow.

## 2016-02-04 NOTE — Telephone Encounter (Signed)
Pt aware ok per mad to start atb tomorrow.

## 2016-02-04 NOTE — Progress Notes (Signed)
Chief complaint: 1. Abnormal uterine bleeding  35 year old white female status post vaginal delivery 12/02/2015, history of retained placenta status post D&C presents now for evaluation of abnormal uterine bleeding. Patient is now into her second month of OCPs and has not had suppression of post partum bleeding. Over the past 48 hours she develops severe history cramps and this morning past a 6 x 4 cm decidual-like tissue fragment; cramping has diminished. Patient is still bleeding.  Past medical history, past surgical history, problem list, medications, and allergies are reviewed  OBJECTIVE: BP 132/76 mmHg  Pulse 99  Ht 5\' 4"  (1.626 m)  Wt 194 lb 4.8 oz (88.134 kg)  BMI 33.34 kg/m2  Breastfeeding? No Pleasant white female in no acute distress Abdomen: Soft, nontender without organomegaly Pelvic exam: External genitalia blood-tinged perineum BUS-normal Vagina-burgundy blood in vault; bloody mucus at os Cervix- PROCEDURE: open to ring forceps; no significant residual tissue removed with ring forceps; serrated curet is used to assess for residual fragments, normal cry within uterus without evidence of retained tissue Uterus-midplane, top normal size, nontender tender Adnexa-nonpalpable and nontender RV-normal external exam  ASSESSMENT 1. Abnormal uterine bleeding postpartum, persisting while on second pack of OCPs 2. History of vaginal delivery complicated by retained products of conception requiring D&C 3. Recent passage of decidual-like tissue fragment  PLAN: 1. Curettage of endometrium within office-no significant residual tissue 2. Methergine 0.2 mg by mouth every 6 hours 4 3. Continue OCPs 4. Doxycycline 100 mg twice a day for 7 days 5. Return in 1 week 6. Ultrasound is scheduled.  A total of 15 minutes were spent face-to-face with the patient during this encounter and over half of that time dealt with counseling and coordination of care.   Brayton Mars,  MD  Note: This dictation was prepared with Dragon dictation along with smaller phrase technology. Any transcriptional errors that result from this process are unintentional.

## 2016-02-06 LAB — PATHOLOGY

## 2016-02-07 ENCOUNTER — Telehealth: Payer: Self-pay | Admitting: Obstetrics and Gynecology

## 2016-02-07 MED ORDER — DESOGESTREL-ETHINYL ESTRADIOL 0.15-0.02/0.01 MG (21/5) PO TABS: 1.0000 | ORAL_TABLET | Freq: Every day | ORAL | Status: DC

## 2016-02-07 NOTE — Telephone Encounter (Signed)
Pt is still having very heavy bleeding. She feels it gushing out when she stands. Per mad pt to take 3 ocp for 3 days, 2 for 2 days and 1 thereafter. Pt encouraged to take ibup 800 q8h for h/a. Stay hydrated and complete atb. If no better, she will need to be seen. Refill ocp erx.

## 2016-02-07 NOTE — Telephone Encounter (Signed)
Patient called stating she is still bleeding very heavy and wanted to know if it is normal.Thanks

## 2016-02-10 ENCOUNTER — Other Ambulatory Visit: Payer: Self-pay

## 2016-02-10 MED ORDER — DESOGESTREL-ETHINYL ESTRADIOL 0.15-0.02/0.01 MG (21/5) PO TABS: 1.0000 | ORAL_TABLET | Freq: Every day | ORAL | Status: DC

## 2016-02-10 NOTE — Telephone Encounter (Signed)
Pt aware rx erx with correct directions.

## 2016-02-11 ENCOUNTER — Ambulatory Visit: Payer: Medicaid Other | Admitting: Obstetrics and Gynecology

## 2016-02-12 ENCOUNTER — Ambulatory Visit (INDEPENDENT_AMBULATORY_CARE_PROVIDER_SITE_OTHER): Payer: Self-pay | Admitting: Obstetrics and Gynecology

## 2016-02-12 ENCOUNTER — Encounter: Payer: Self-pay | Admitting: Obstetrics and Gynecology

## 2016-02-12 DIAGNOSIS — N939 Abnormal uterine and vaginal bleeding, unspecified: Secondary | ICD-10-CM

## 2016-02-12 DIAGNOSIS — R5383 Other fatigue: Secondary | ICD-10-CM

## 2016-02-12 MED ORDER — CYANOCOBALAMIN 1000 MCG/ML IJ SOLN
1000.0000 ug | Freq: Once | INTRAMUSCULAR | Status: AC
  Administered 2016-02-12: 1000 ug via INTRAMUSCULAR

## 2016-02-12 NOTE — Patient Instructions (Signed)
1. Continue with birth control pills for 3 months 2. Return to see [facility] as scheduled in 4 months 3. Notify us if abnormal bleeding or fever develops

## 2016-02-12 NOTE — Progress Notes (Signed)
Patient ID: Julia Gay, female   DOB: Dec 18, 1980, 35 y.o.   MRN: [id number]  Per mns- pt started on adipex and b12. B12 inj given today. Hand written rx given to pt. May start 1/2 tab for 1 week then 1 tab. Aware of dry mouth and tachycardia s/e. Pt aware to bring b12 to next visit in 4 weeks. [place]   Chief complaint: 1.  Abnormal uterine bleeding.  Patient presents for 1 week follow-up after evaluation for heavy abnormal uterine bleeding in the second month of Birth control pills, following a D&C performed for retained products of conception after delivery.  Patient passed a 3 x 5 cm tissue specimen-pathology that demonstrated decidua with chorionic villi consistent with retained products of conception.  At last visit.  Patient was placed on Methergine for 24 hours; was given doxycycline to be taken for 14 days, medication discontinued due to intolerance after several days of pill taking.  She also was to triple-up on her birth control pills but did not do this because of insurance cost constraints.  She is still taking her birth control pills 1 daily.  Patient reports resolution of pelvic pain and abnormal bleeding at this time.  Past medical history, past surgical history, problem list, medications, and allergies are reviewed.  OBJECTIVE: BP 111/76 mmHg  Pulse 99  Ht 5\' 4"  (1.626 m)  Wt 195 lb 9.6 oz (88.724 kg)  BMI 33.56 kg/m2 Pleasant white female in no acute distress. Back: No CVA tenderness or spinal tenderness. Abdomen: Soft, nontender, without organomegaly. Pelvic exam: Surgeon Julia Gay-normal BUS-normal Vagina-normal without discharge or blood in vaginal vault Cervix-no cervical motion tenderness Uterus-nontender, mobile, top normal size Adnexa-nonpalpable and nontender. Rectovaginal-normal.  External exam.  ASSESSMENT: 1.  Resolution of abnormal uterine bleeding after passage of tissue fragment consistent with POC. 2.  No evidence of endometritis.   PLAN: 1.   Discontinue doxycycline antibiotic. 2.  Continue with oral contraceptives on a daily basis over the next 3 packs 3.  Return to see Julia Gay is scheduled in 3 months.  A total of 15 minutes were spent face-to-face with the patient during this encounter and over half of that time dealt with counseling and coordination of care.  Julia Mars, MD  Note: This dictation was prepared with Dragon dictation along with smaller phrase technology. Any transcriptional errors that result from this process are unintentional.

## 2016-02-13 ENCOUNTER — Ambulatory Visit: Payer: Medicaid Other

## 2016-03-11 ENCOUNTER — Ambulatory Visit: Payer: Medicaid Other

## 2016-03-18 ENCOUNTER — Other Ambulatory Visit: Payer: Self-pay | Admitting: Obstetrics and Gynecology

## 2016-03-23 NOTE — Telephone Encounter (Signed)
Julia Gay is she to continue this medication???

## 2016-05-06 ENCOUNTER — Telehealth: Payer: Self-pay | Admitting: Obstetrics and Gynecology

## 2016-05-06 NOTE — Telephone Encounter (Signed)
Is she still taking her BC pills? If so we may need to change the type she is on. If not, let me know.

## 2016-05-06 NOTE — Telephone Encounter (Signed)
Pt hasnt had a period for 2 months just some spotting and wants to know if this is normal.

## 2016-05-06 NOTE — Telephone Encounter (Signed)
pls advise

## 2016-05-20 ENCOUNTER — Encounter: Payer: Medicaid Other | Admitting: Obstetrics and Gynecology

## 2016-05-21 ENCOUNTER — Other Ambulatory Visit (INDEPENDENT_AMBULATORY_CARE_PROVIDER_SITE_OTHER): Payer: Self-pay

## 2016-05-21 ENCOUNTER — Other Ambulatory Visit: Payer: Medicaid Other

## 2016-05-21 ENCOUNTER — Other Ambulatory Visit: Payer: Self-pay | Admitting: Primary Care

## 2016-05-21 DIAGNOSIS — Z0000 Encounter for general adult medical examination without abnormal findings: Secondary | ICD-10-CM

## 2016-05-21 LAB — LIPID PANEL
Cholesterol: 193 mg/dL (ref 0–200)
HDL: 38.5 mg/dL — ABNORMAL LOW
LDL Cholesterol: 130 mg/dL — ABNORMAL HIGH (ref 0–99)
NonHDL: 154.85
Total CHOL/HDL Ratio: 5
Triglycerides: 123 mg/dL (ref 0.0–149.0)
VLDL: 24.6 mg/dL (ref 0.0–40.0)

## 2016-05-21 LAB — COMPREHENSIVE METABOLIC PANEL WITH GFR
ALT: 24 U/L (ref 0–35)
AST: 23 U/L (ref 0–37)
Albumin: 4.5 g/dL (ref 3.5–5.2)
Alkaline Phosphatase: 65 U/L (ref 39–117)
BUN: 13 mg/dL (ref 6–23)
CO2: 28 meq/L (ref 19–32)
Calcium: 9.1 mg/dL (ref 8.4–10.5)
Chloride: 104 meq/L (ref 96–112)
Creatinine, Ser: 0.71 mg/dL (ref 0.40–1.20)
GFR: 98.95 mL/min
Glucose, Bld: 82 mg/dL (ref 70–99)
Potassium: 3.9 meq/L (ref 3.5–5.1)
Sodium: 138 meq/L (ref 135–145)
Total Bilirubin: 0.5 mg/dL (ref 0.2–1.2)
Total Protein: 7.8 g/dL (ref 6.0–8.3)

## 2016-05-21 LAB — HEMOGLOBIN A1C: Hgb A1c MFr Bld: 4.8 % (ref 4.6–6.5)

## 2016-05-21 NOTE — Telephone Encounter (Signed)
Tried calling pt several times-na 

## 2016-05-26 ENCOUNTER — Encounter: Payer: Medicaid Other | Admitting: Primary Care

## 2016-05-28 ENCOUNTER — Other Ambulatory Visit (HOSPITAL_COMMUNITY)
Admission: RE | Admit: 2016-05-28 | Discharge: 2016-05-28 | Disposition: A | Discharge status: Home / Self Care | Payer: Self-pay | Source: Ambulatory Visit | Attending: Internal Medicine | Admitting: Internal Medicine

## 2016-05-28 ENCOUNTER — Encounter: Payer: Self-pay | Admitting: Primary Care

## 2016-05-28 ENCOUNTER — Ambulatory Visit (INDEPENDENT_AMBULATORY_CARE_PROVIDER_SITE_OTHER): Payer: Self-pay | Admitting: Primary Care

## 2016-05-28 VITALS — BP 122/76 | HR 68 | Temp 98.8°F | Ht 64.0 in | Wt 198.4 lb

## 2016-05-28 DIAGNOSIS — Z1151 Encounter for screening for human papillomavirus (HPV): Secondary | ICD-10-CM | POA: Insufficient documentation

## 2016-05-28 DIAGNOSIS — E559 Vitamin D deficiency, unspecified: Secondary | ICD-10-CM

## 2016-05-28 DIAGNOSIS — Z0000 Encounter for general adult medical examination without abnormal findings: Secondary | ICD-10-CM

## 2016-05-28 DIAGNOSIS — Z124 Encounter for screening for malignant neoplasm of cervix: Secondary | ICD-10-CM

## 2016-05-28 DIAGNOSIS — Z01419 Encounter for gynecological examination (general) (routine) without abnormal findings: Secondary | ICD-10-CM | POA: Insufficient documentation

## 2016-05-28 NOTE — Patient Instructions (Signed)
Your labs show slight elevation in bad cholesterol (LDL). This can be lowered with exercise and healthy diet.  Start exercising. You should be getting 150 minutes of moderate intensity exercise weekly.  It is important that you improve your diet. Please limit fast food, junk food, sugary drinks, etc. Increase your consumption of fresh fruits and vegetables, whole grains, lean protein.  Ensure you are consuming 64 ounces of water daily.  We will notify you of your pap results once received.  Follow up in 1 year for repeat physical or sooner if needed.  It was a pleasure to see you today!

## 2016-05-28 NOTE — Assessment & Plan Note (Signed)
Tetanus UTD. Declines influenza. Pap UTD, however patient would like repeated. Pending. Discussed the importance of a healthy diet and regular exercise in order for weight loss and to reduce risk of other medical diseases. Exam unremarkable. Labs stable. Follow up in 1 year for repeat physical.

## 2016-05-28 NOTE — Progress Notes (Signed)
Subjective:    Patient ID: Julia Gay, female    DOB: [birth year], 35 y.o.   MRN: [id number]  HPI  Julia Gay is a 35 year old female who presents today for complete physical.  Immunizations: -Tetanus: Completed in February 2017 -Influenza: Declines  Diet: She endorses a healthy diet. Breakfast: Biscuit, skips Lunch: Sandwich, fast food Dinner: Vegetables, pasta Snacks: None Desserts: 4 times weekly Beverages: Water (3 bottles daily), mountain dew  Exercise: She does not currently exercising. Eye exam: Completed 2 years ago. Dental exam: Completes annually. Pap Smear: Completed in September 2016, normal. Would like repeated today.     Review of Systems  Constitutional: Negative for unexpected weight change.  HENT: Negative for rhinorrhea.   Respiratory: Negative for cough and shortness of breath.   Cardiovascular: Negative for chest pain.  Gastrointestinal: Negative for constipation and diarrhea.  Genitourinary: Negative for difficulty urinating and menstrual problem.  Musculoskeletal: Negative for arthralgias and myalgias.  Skin: Negative for rash.  Allergic/Immunologic: Negative for environmental allergies.  Neurological: Negative for dizziness, numbness and headaches.       Past Medical History:  Diagnosis Date  . Hay fever   . History of HPV infection   . History of UTI      Social History   Social History  . Marital status: Married    Spouse name: N/A  . Number of children: N/A  . Years of education: N/A   Occupational History  . Not on file.   Social History Main Topics  . Smoking status: Former Research scientist (life sciences)  . Smokeless tobacco: Never Used     Comment: quit in 2004  . Alcohol use 0.0 oz/week     Comment: socially  . Drug use: No  . Sexual activity: Not Currently    Birth control/ protection: None   Other Topics Concern  . Not on file   Social History Narrative   Married.    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   Has 1 child.     Enjoys playing soccer and tennis, singing, watching TV.       Past Surgical History:  Procedure Laterality Date  . DILATION AND CURETTAGE OF UTERUS N/A 12/17/2015   Procedure: DILATATION AND CURETTAGE;  Surgeon: Brayton Mars, MD;  Location: [facility] ORS;  Service: Gynecology;  Laterality: N/A;  . LEEP  2006    Family History  Problem Relation Age of Onset  . Alcohol abuse Father     quit when pt was a child  . Alcohol abuse Other     both grandparents  . Heart disease Father   . Stroke Father   . Hypertension Father   . Anemia Mother   . Aplastic anemia Father     No Known Allergies  Current Outpatient Prescriptions on File Prior to Visit  Medication Sig Dispense Refill  . desogestrel-ethinyl estradiol (MIRCETTE) 0.15-0.02/0.01 MG (21/5) tablet Take 1 tablet by mouth daily. 3 Package 0  . ferrous sulfate 325 (65 FE) MG tablet Take 325 mg by mouth daily with breakfast.    . Iron-FA-B Cmp-C-Biot-Probiotic (FUSION PLUS) CAPS TAKE ONE CAPSULE BY MOUTH ONCE DAILY 60 capsule 0  . sertraline (ZOLOFT) 50 MG tablet Take 1 tablet by mouth daily. Reported on 01/13/2016    . vitamin B-12 (CYANOCOBALAMIN) 100 MCG tablet Take 100 mcg by mouth daily.     No current facility-administered medications on file prior to visit.     BP 122/76   Pulse 68  Temp 98.8 F (37.1 C) (Oral)   Ht 5\' 4"  (1.626 m)   Wt 198 lb 6.4 oz (90 kg)   LMP 05/22/2016   SpO2 98%   BMI 34.06 kg/m    Objective:   Physical Exam  Constitutional: She is oriented to person, place, and time. She appears well-nourished.  HENT:  Right Ear: Tympanic membrane and ear canal normal.  Left Ear: Tympanic membrane and ear canal normal.  Nose: Nose normal.  Mouth/Throat: Oropharynx is clear and moist.  Eyes: Conjunctivae and EOM are normal. Pupils are equal, round, and reactive to light.  Neck: Neck supple. No thyromegaly present.  Cardiovascular: Normal rate and regular rhythm.   No murmur  heard. Pulmonary/Chest: Effort normal and breath sounds normal. She has no rales. Right breast exhibits no mass, no nipple discharge, no skin change and no tenderness. Left breast exhibits no mass, no nipple discharge, no skin change and no tenderness.  Abdominal: Soft. Bowel sounds are normal. There is no tenderness.  Genitourinary: There is no tenderness on the right labia. There is no tenderness on the left labia. Cervix exhibits no motion tenderness and no discharge. Right adnexum displays no tenderness. Left adnexum displays no tenderness. No vaginal discharge found.  Musculoskeletal: Normal range of motion.  Lymphadenopathy:    She has no cervical adenopathy.  Neurological: She is alert and oriented to person, place, and time. She has normal reflexes. No cranial nerve deficit.  Skin: Skin is warm and dry. No rash noted.  Psychiatric: She has a normal mood and affect.          Assessment & Plan:

## 2016-05-28 NOTE — Progress Notes (Signed)
Pre visit review using our clinic review tool, if applicable. No additional management support is needed unless otherwise documented below in the visit note. 

## 2016-05-28 NOTE — Assessment & Plan Note (Signed)
Vitamin d level stable in May 2017. Managed on 50,000 unit capsules per GYN. Patient now on multi-vitamin with vitamin D.  Will repeat levels in several months.

## 2016-05-29 LAB — CYTOLOGY - PAP

## 2017-07-19 ENCOUNTER — Other Ambulatory Visit: Payer: Self-pay | Admitting: Family Medicine

## 2017-07-19 DIAGNOSIS — R079 Chest pain, unspecified: Secondary | ICD-10-CM

## 2017-07-19 DIAGNOSIS — R1011 Right upper quadrant pain: Secondary | ICD-10-CM

## 2017-07-19 DIAGNOSIS — R112 Nausea with vomiting, unspecified: Secondary | ICD-10-CM

## 2017-07-23 ENCOUNTER — Other Ambulatory Visit: Payer: Self-pay

## 2018-02-14 ENCOUNTER — Ambulatory Visit (INDEPENDENT_AMBULATORY_CARE_PROVIDER_SITE_OTHER): Payer: Self-pay | Admitting: Obstetrics & Gynecology

## 2018-02-14 ENCOUNTER — Encounter: Payer: Self-pay | Admitting: Obstetrics & Gynecology

## 2018-02-14 VITALS — BP 100/70 | Wt 196.0 lb

## 2018-02-14 DIAGNOSIS — O09521 Supervision of elderly multigravida, first trimester: Secondary | ICD-10-CM

## 2018-02-14 DIAGNOSIS — Z3A08 8 weeks gestation of pregnancy: Secondary | ICD-10-CM

## 2018-02-14 DIAGNOSIS — O09291 Supervision of pregnancy with other poor reproductive or obstetric history, first trimester: Secondary | ICD-10-CM

## 2018-02-14 DIAGNOSIS — O0991 Supervision of high risk pregnancy, unspecified, first trimester: Secondary | ICD-10-CM

## 2018-02-14 DIAGNOSIS — O09891 Supervision of other high risk pregnancies, first trimester: Secondary | ICD-10-CM

## 2018-02-14 DIAGNOSIS — O09211 Supervision of pregnancy with history of pre-term labor, first trimester: Secondary | ICD-10-CM

## 2018-02-14 NOTE — Patient Instructions (Signed)
First Trimester of Pregnancy The first trimester of pregnancy is from week 1 until the end of week 13 (months 1 through 3). A week after a sperm fertilizes an egg, the egg will implant on the wall of the uterus. This embryo will begin to develop into a baby. Genes from you and your partner will form the baby. The female genes will determine whether the baby will be a boy or a girl. At 6-8 weeks, the eyes and face will be formed, and the heartbeat can be seen on ultrasound. At the end of 12 weeks, all the baby's organs will be formed. Now that you are pregnant, you will want to do everything you can to have a healthy baby. Two of the most important things are to get good prenatal care and to follow your health care provider's instructions. Prenatal care is all the medical care you receive before the baby's birth. This care will help prevent, find, and treat any problems during the pregnancy and childbirth. Body changes during your first trimester Your body goes through many changes during pregnancy. The changes vary from woman to woman.  You may gain or lose a couple of pounds at first.  You may feel sick to your stomach (nauseous) and you may throw up (vomit). If the vomiting is uncontrollable, call your health care provider.  You may tire easily.  You may develop headaches that can be relieved by medicines. All medicines should be approved by your health care provider.  You may urinate more often. Painful urination may mean you have a bladder infection.  You may develop heartburn as a result of your pregnancy.  You may develop constipation because certain hormones are causing the muscles that push stool through your intestines to slow down.  You may develop hemorrhoids or swollen veins (varicose veins).  Your breasts may begin to grow larger and become tender. Your nipples may stick out more, and the tissue that surrounds them (areola) may become darker.  Your gums may bleed and may be  sensitive to brushing and flossing.  Dark spots or blotches (chloasma, mask of pregnancy) may develop on your face. This will likely fade after the baby is born.  Your menstrual periods will stop.  You may have a loss of appetite.  You may develop cravings for certain kinds of food.  You may have changes in your emotions from day to day, such as being excited to be pregnant or being concerned that something may go wrong with the pregnancy and baby.  You may have more vivid and strange dreams.  You may have changes in your hair. These can include thickening of your hair, rapid growth, and changes in texture. Some women also have hair loss during or after pregnancy, or hair that feels dry or thin. Your hair will most likely return to normal after your baby is born.  What to expect at prenatal visits During a routine prenatal visit:  You will be weighed to make sure you and the baby are growing normally.  Your blood pressure will be taken.  Your abdomen will be measured to track your baby's growth.  The fetal heartbeat will be listened to between weeks 10 and 14 of your pregnancy.  Test results from any previous visits will be discussed.  Your health care provider may ask you:  How you are feeling.  If you are feeling the baby move.  If you have had any abnormal symptoms, such as leaking fluid, bleeding, severe headaches,   or abdominal cramping.  If you are using any tobacco products, including cigarettes, chewing tobacco, and electronic cigarettes.  If you have any questions.  Other tests that may be performed during your first trimester include:  Blood tests to find your blood type and to check for the presence of any previous infections. The tests will also be used to check for low iron levels (anemia) and protein on red blood cells (Rh antibodies). Depending on your risk factors, or if you previously had diabetes during pregnancy, you may have tests to check for high blood  sugar that affects pregnant women (gestational diabetes).  Urine tests to check for infections, diabetes, or protein in the urine.  An ultrasound to confirm the proper growth and development of the baby.  Fetal screens for spinal cord problems (spina bifida) and Down syndrome.  HIV (human immunodeficiency virus) testing. Routine prenatal testing includes screening for HIV, unless you choose not to have this test.  You may need other tests to make sure you and the baby are doing well.  Follow these instructions at home: Medicines  Follow your health care provider's instructions regarding medicine use. Specific medicines may be either safe or unsafe to take during pregnancy.  Take a prenatal vitamin that contains at least 600 micrograms (mcg) of folic acid.  If you develop constipation, try taking a stool softener if your health care provider approves. Eating and drinking  Eat a balanced diet that includes fresh fruits and vegetables, whole grains, good sources of protein such as meat, eggs, or tofu, and low-fat dairy. Your health care provider will help you determine the amount of weight gain that is right for you.  Avoid raw meat and uncooked cheese. These carry germs that can cause birth defects in the baby.  Eating four or five small meals rather than three large meals a day may help relieve nausea and vomiting. If you start to feel nauseous, eating a few soda crackers can be helpful. Drinking liquids between meals, instead of during meals, also seems to help ease nausea and vomiting.  Limit foods that are high in fat and processed sugars, such as fried and sweet foods.  To prevent constipation: ? Eat foods that are high in fiber, such as fresh fruits and vegetables, whole grains, and beans. ? Drink enough fluid to keep your urine clear or pale yellow. Activity  Exercise only as directed by your health care provider. Most women can continue their usual exercise routine during  pregnancy. Try to exercise for 30 minutes at least 5 days a week. Exercising will help you: ? Control your weight. ? Stay in shape. ? Be prepared for labor and delivery.  Experiencing pain or cramping in the lower abdomen or lower back is a good sign that you should stop exercising. Check with your health care provider before continuing with normal exercises.  Try to avoid standing for long periods of time. Move your legs often if you must stand in one place for a long time.  Avoid heavy lifting.  Wear low-heeled shoes and practice good posture.  You may continue to have sex unless your health care provider tells you not to. Relieving pain and discomfort  Wear a good support bra to relieve breast tenderness.  Take warm sitz baths to soothe any pain or discomfort caused by hemorrhoids. Use hemorrhoid cream if your health care provider approves.  Rest with your legs elevated if you have leg cramps or low back pain.  If you develop   varicose veins in your legs, wear support hose. Elevate your feet for 15 minutes, 3-4 times a day. Limit salt in your diet. Prenatal care  Schedule your prenatal visits by the twelfth week of pregnancy. They are usually scheduled monthly at first, then more often in the last 2 months before delivery.  Write down your questions. Take them to your prenatal visits.  Keep all your prenatal visits as told by your health care provider. This is important. Safety  Wear your seat belt at all times when driving.  Make a list of emergency phone numbers, including numbers for family, friends, the hospital, and police and fire departments. General instructions  Ask your health care provider for a referral to a local prenatal education class. Begin classes no later than the beginning of month 6 of your pregnancy.  Ask for help if you have counseling or nutritional needs during pregnancy. Your health care provider can offer advice or refer you to specialists for help  with various needs.  Do not use hot tubs, steam rooms, or saunas.  Do not douche or use tampons or scented sanitary pads.  Do not cross your legs for long periods of time.  Avoid cat litter boxes and soil used by cats. These carry germs that can cause birth defects in the baby and possibly loss of the fetus by miscarriage or stillbirth.  Avoid all smoking, herbs, alcohol, and medicines not prescribed by your health care provider. Chemicals in these products affect the formation and growth of the baby.  Do not use any products that contain nicotine or tobacco, such as cigarettes and e-cigarettes. If you need help quitting, ask your health care provider. You may receive counseling support and other resources to help you quit.  Schedule a dentist appointment. At home, brush your teeth with a soft toothbrush and be gentle when you floss. Contact a health care provider if:  You have dizziness.  You have mild pelvic cramps, pelvic pressure, or nagging pain in the abdominal area.  You have persistent nausea, vomiting, or diarrhea.  You have a bad smelling vaginal discharge.  You have pain when you urinate.  You notice increased swelling in your face, hands, legs, or ankles.  You are exposed to fifth disease or chickenpox.  You are exposed to German measles (rubella) and have never had it. Get help right away if:  You have a fever.  You are leaking fluid from your vagina.  You have spotting or bleeding from your vagina.  You have severe abdominal cramping or pain.  You have rapid weight gain or loss.  You vomit blood or material that looks like coffee grounds.  You develop a severe headache.  You have shortness of breath.  You have any kind of trauma, such as from a fall or a car accident. Summary  The first trimester of pregnancy is from week 1 until the end of week 13 (months 1 through 3).  Your body goes through many changes during pregnancy. The changes vary from  woman to woman.  You will have routine prenatal visits. During those visits, your health care provider will examine you, discuss any test results you may have, and talk with you about how you are feeling. This information is not intended to replace advice given to you by your health care provider. Make sure you discuss any questions you have with your health care provider. Document Released: 08/18/2001 Document Revised: 08/05/2016 Document Reviewed: 08/05/2016 Elsevier Interactive Patient Education  2018 Elsevier   Inc.  

## 2018-02-14 NOTE — Progress Notes (Signed)
02/14/2018   Chief Complaint: Missed period  Transfer of Care Patient: no  History of Present Illness: Julia Gay is a 37 y.o. [id number] [redacted]w[redacted]d based on Patient's last menstrual period was 12/18/2017. with an Estimated Date of Delivery: 09/24/18, with the above CC.   Her periods were: regular periods every 28 days She was using no method when she conceived.  She has Positive signs or symptoms of nausea/vomiting of pregnancy. She has Negative signs or symptoms of miscarriage or preterm labor She identifies Negative Zika risk factors for her and her partner On any different medications around the time she conceived/early pregnancy: Yes - Zoloft History of varicella: Yes   ROS: A 12-point review of systems was performed and negative, except as stated in the above HPI.  OBGYN History: As per HPI. OB History  Gravida Para Term Preterm AB Living  4 2 1 1 1 1   SAB TAB Ectopic Multiple Live Births  1     0 1    # Outcome Date GA Lbr Len/2nd Weight Sex Delivery Anes PTL Lv  4 Current           3 Preterm 12/02/15 [redacted]w[redacted]d / 00:33 7 lb 7.9 oz (3.4 kg) M Vag-Spont EPI  LIV  2 SAB 12/2014        FD  1 Term 01/17/02 [redacted]w[redacted]d  7 lb 6 oz (3.345 kg) M Vag-Spont  N      Complications: Preeclampsia complicating hypertension    Any issues with any prior pregnancies: Preeclamspia for G1, PTL/PPROM 35 weeks for G2 Any prior children are healthy, doing well, without any problems or issues: yes History of pap smears: Yes. Last pap smear 2017. Abnormal: no (prior LEEP 14 years ago, no sequele) History of STIs: No   Past Medical History: Past Medical History:  Diagnosis Date  . Hay fever   . History of HPV infection   . History of UTI   . Retained placenta     Past Surgical History: Past Surgical History:  Procedure Laterality Date  . DILATION AND CURETTAGE OF UTERUS N/A 12/17/2015   Procedure: DILATATION AND CURETTAGE;  Surgeon: Brayton Mars, MD;  Location: [facility] ORS;  Service: Gynecology;   Laterality: N/A;  . LEEP  2006    Family History:  Family History  Problem Relation Age of Onset  . Alcohol abuse Father        quit when pt was a child  . Heart disease Father   . Stroke Father   . Hypertension Father   . Aplastic anemia Father   . Alcohol abuse Other        both grandparents  . Anemia Mother    She denies any female cancers, bleeding or blood clotting disorders.  She denies any history of mental retardation, birth defects or genetic disorders in her or the FOB's history  Social History:  Social History   Socioeconomic History  . Marital status: Married    Spouse name: Not on file  . Number of children: Not on file  . Years of education: Not on file  . Highest education level: Not on file  Occupational History  . Not on file  Social Needs  . Financial resource strain: Not on file  . Food insecurity:    Worry: Not on file    Inability: Not on file  . Transportation needs:    Medical: Not on file    Non-medical: Not on file  Tobacco Use  . Smoking status:  Former Smoker  . Smokeless tobacco: Never Used  . Tobacco comment: quit in 2004  Substance and Sexual Activity  . Alcohol use: Yes    Alcohol/week: 0.0 oz    Comment: socially  . Drug use: No  . Sexual activity: Not Currently    Birth control/protection: None  Lifestyle  . Physical activity:    Days per week: Not on file    Minutes per session: Not on file  . Stress: Not on file  Relationships  . Social connections:    Talks on phone: Not on file    Gets together: Not on file    Attends religious service: Not on file    Active member of club or organization: Not on file    Attends meetings of clubs or organizations: Not on file    Relationship status: Not on file  . Intimate partner violence:    Fear of current or ex partner: Not on file    Emotionally abused: Not on file    Physically abused: Not on file    Forced sexual activity: Not on file  Other Topics Concern  . Not on file    Social History Narrative   Married.    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   Has 1 child.   Enjoys playing soccer and tennis, singing, watching TV.   Any pets in the household: no  Allergy: No Known Allergies  Current Outpatient Medications:  Current Outpatient Medications:  .  Iron-FA-B Cmp-C-Biot-Probiotic (FUSION PLUS) CAPS, TAKE ONE CAPSULE BY MOUTH ONCE DAILY, Disp: 60 capsule, Rfl: 0 .  Prenatal Vit-Fe Fumarate-FA (PREPLUS) 27-1 MG TABS, Take 1 tablet by mouth daily., Disp: , Rfl: 2 .  sertraline (ZOLOFT) 50 MG tablet, Take 1 tablet by mouth daily. Reported on 01/13/2016, Disp: , Rfl:  .  desogestrel-ethinyl estradiol (MIRCETTE) 0.15-0.02/0.01 MG (21/5) tablet, Take 1 tablet by mouth daily. (Patient not taking: Reported on 02/14/2018), Disp: 3 Package, Rfl: 0 .  ferrous sulfate 325 (65 FE) MG tablet, Take 325 mg by mouth daily with breakfast., Disp: , Rfl:  .  vitamin B-12 (CYANOCOBALAMIN) 100 MCG tablet, Take 100 mcg by mouth daily., Disp: , Rfl:    Physical Exam:   BP 100/70   Wt 196 lb (88.9 kg)   LMP 12/18/2017   BMI 33.64 kg/m  Body mass index is 33.64 kg/m. Constitutional: Well nourished, well developed female in no acute distress.  Neck:  Supple, normal appearance, and no thyromegaly  Cardiovascular: S1, S2 normal, no murmur, rub or gallop, regular rate and rhythm Respiratory:  Clear to auscultation bilateral. Normal respiratory effort Abdomen: positive bowel sounds and no masses, hernias; diffusely non tender to palpation, non distended Breasts: breasts appear normal, no suspicious masses, no skin or nipple changes or axillary nodes. Neuro/Psych:  Normal mood and affect.  Skin:  Warm and dry.  Lymphatic:  No inguinal lymphadenopathy.   Pelvic exam: is not limited by body habitus EGBUS: within normal limits, Vagina: within normal limits and with no blood in the vault, Cervix: normal appearing cervix without discharge or lesions, closed/long/high,  Uterus:  enlarged: 6 weeks, and Adnexa:  normal adnexa  Assessment: Julia Gay is a 37 y.o. [id number] [redacted]w[redacted]d based on Patient's last menstrual period was 12/18/2017. with an Estimated Date of Delivery: 09/24/18,  for prenatal care.  Plan:  1) Avoid alcoholic beverages. 2) Patient encouraged not to smoke.  3) Discontinue the use of all non-medicinal drugs and chemicals.  4) Take  prenatal vitamins daily.  5) Seatbelt use advised 6) Nutrition, food safety (fish, cheese advisories, and high nitrite foods) and exercise discussed. 7) Hospital and practice style delivering at [facility] discussed  8) Patient is asked about travel to areas at risk for the [place] virus, and counseled to avoid travel and exposure to mosquitoes or sexual partners who may have themselves been exposed to the virus. Testing is discussed, and will be ordered as appropriate.  9) Childbirth classes at [facility] advised 10) Genetic Screening, such as with 1st Trimester Screening, cell free fetal DNA, AFP testing, and Ultrasound, as well as with amniocentesis and CVS as appropriate, is discussed with patient. She plans to have genetic testing this pregnancy. 47) [place] soon 12) AMA, plan [place] 47) Cont Zoloft for depression, pros and cons in pregnancy discussed 14) PTL risks discussed.  17-OHP from 16-36 weeks discussed and desired  Problem list reviewed and updated.  Barnett Applebaum, MD, [facility], [facility] 02/14/2018  10:17 AM

## 2018-02-15 LAB — RPR+RH+ABO+RUB AB+AB SCR+CB...
ANTIBODY SCREEN: NEGATIVE
HEMOGLOBIN: 12.4 g/dL (ref 11.1–15.9)
HIV Screen 4th Generation wRfx: NONREACTIVE
Hematocrit: 38.2 % (ref 34.0–46.6)
Hepatitis B Surface Ag: NEGATIVE
MCH: 27.4 pg (ref 26.6–33.0)
MCHC: 32.5 g/dL (ref 31.5–35.7)
MCV: 85 fL (ref 79–97)
Platelets: 291 10*3/uL (ref 150–450)
RBC: 4.52 x10E6/uL (ref 3.77–5.28)
RDW: 13.9 % (ref 12.3–15.4)
RPR Ser Ql: NONREACTIVE
RUBELLA: 3.49 {index} (ref >0.99)
Rh Factor: POSITIVE
Varicella zoster IgG: 2603 index (ref >165)
WBC: 9.6 10*3/uL (ref 3.4–10.8)

## 2018-02-16 LAB — GC/CHLAMYDIA PROBE AMP
CHLAMYDIA, DNA PROBE: NEGATIVE
NEISSERIA GONORRHOEAE BY PCR: NEGATIVE

## 2018-02-16 LAB — DRUG SCREEN, URINE
AMPHETAMINES, URINE: NEGATIVE ng/mL
BARBITURATE SCREEN URINE: NEGATIVE ng/mL
Benzodiazepine Quant, Ur: NEGATIVE ng/mL
COCAINE (METAB.): NEGATIVE ng/mL
Cannabinoid Quant, Ur: NEGATIVE ng/mL
Opiate Quant, Ur: NEGATIVE ng/mL
PCP Quant, Ur: NEGATIVE ng/mL

## 2018-02-16 LAB — URINE CULTURE

## 2018-02-24 ENCOUNTER — Ambulatory Visit (INDEPENDENT_AMBULATORY_CARE_PROVIDER_SITE_OTHER): Payer: Medicaid Other

## 2018-02-24 ENCOUNTER — Ambulatory Visit (INDEPENDENT_AMBULATORY_CARE_PROVIDER_SITE_OTHER): Payer: Self-pay | Admitting: Obstetrics & Gynecology

## 2018-02-24 VITALS — BP 120/80 | Wt 197.0 lb

## 2018-02-24 DIAGNOSIS — O09291 Supervision of pregnancy with other poor reproductive or obstetric history, first trimester: Secondary | ICD-10-CM

## 2018-02-24 DIAGNOSIS — O09891 Supervision of other high risk pregnancies, first trimester: Secondary | ICD-10-CM

## 2018-02-24 DIAGNOSIS — O09211 Supervision of pregnancy with history of pre-term labor, first trimester: Secondary | ICD-10-CM | POA: Diagnosis not present

## 2018-02-24 DIAGNOSIS — O09521 Supervision of elderly multigravida, first trimester: Secondary | ICD-10-CM

## 2018-02-24 DIAGNOSIS — Z3A09 9 weeks gestation of pregnancy: Secondary | ICD-10-CM | POA: Diagnosis not present

## 2018-02-24 DIAGNOSIS — Z3A08 8 weeks gestation of pregnancy: Secondary | ICD-10-CM

## 2018-02-24 DIAGNOSIS — O0991 Supervision of high risk pregnancy, unspecified, first trimester: Secondary | ICD-10-CM

## 2018-02-24 NOTE — Patient Instructions (Signed)

## 2018-02-24 NOTE — Progress Notes (Signed)
  Subjective  Fetal Movement? no Contractions? no Leaking Fluid? no Vaginal Bleeding? no  Objective  BP 120/80   Wt 197 lb (89.4 kg)   LMP 12/18/2017   BMI 33.81 kg/m  General: NAD Pumonary: no increased work of breathing Abdomen: gravid, non-tender Extremities: no edema Psychiatric: mood appropriate, affect full  Assessment  37 y.o. [id number] at [redacted]w[redacted]d by  09/24/2018, by Last Menstrual Period presenting for routine prenatal visit  Plan   Problem List Items Addressed This Visit      Other   AMA (advanced maternal age) multigravida 35+, first trimester   Supervision of high risk pregnancy, antepartum, first trimester    Other Visit Diagnoses    [redacted] weeks gestation of pregnancy    -  Primary    Review of ULTRASOUND.    I have personally reviewed images and report of recent ultrasound done at [facility].    Plan of management to be discussed with patient.  Plan cfDNA nv  Barnett Applebaum, MD, [facility] 02/24/2018  11:43 AM

## 2018-03-11 ENCOUNTER — Ambulatory Visit (INDEPENDENT_AMBULATORY_CARE_PROVIDER_SITE_OTHER): Payer: Medicaid Other | Admitting: Advanced Practice Midwife

## 2018-03-11 ENCOUNTER — Encounter: Payer: Self-pay | Admitting: Advanced Practice Midwife

## 2018-03-11 VITALS — BP 122/84 | Wt 201.0 lb

## 2018-03-11 DIAGNOSIS — Z8759 Personal history of other complications of pregnancy, childbirth and the puerperium: Secondary | ICD-10-CM

## 2018-03-11 DIAGNOSIS — Z1379 Encounter for other screening for genetic and chromosomal anomalies: Secondary | ICD-10-CM

## 2018-03-11 DIAGNOSIS — Z3A11 11 weeks gestation of pregnancy: Secondary | ICD-10-CM

## 2018-03-11 MED ORDER — HYDROXYPROGESTERONE CAPROATE 250 MG/ML IM OIL: 250.0000 mg | TOPICAL_OIL | Freq: Once | INTRAMUSCULAR | Status: DC

## 2018-03-11 NOTE — Progress Notes (Signed)
  Routine Prenatal Care Visit  Subjective  Julia Gay is a 37 y.o. [phone] at [redacted]w[redacted]d being seen today for ongoing prenatal care.  She is currently monitored for the following issues for this high-risk pregnancy and has Obsessive compulsive disorder; Preventative health care; AMA (advanced maternal age) multigravida 74+, first trimester; Vitamin D deficiency; Supervision of high risk pregnancy, antepartum, first trimester; and History of pre-eclampsia in prior pregnancy, currently pregnant in first trimester on their problem list.  ----------------------------------------------------------------------------------- Patient reports no complaints.  MaterniT21 today. Patient aware we will start 17P injections at next visit.  . Vag. Bleeding: None.  Movement: Present. Denies leaking of fluid.  ----------------------------------------------------------------------------------- The following portions of the patient's history were reviewed and updated as appropriate: allergies, current medications, past family history, past medical history, past social history, past surgical history and problem list. Problem list updated.   Objective  Blood pressure 122/84, weight 201 lb (91.2 kg), last menstrual period 12/18/2017. Pregravid weight 193 lb (87.5 kg) Total Weight Gain 8 lb (3.629 kg) Urinalysis: Urine Protein: Negative Urine Glucose: Negative  Fetal Status: Fetal Heart Rate (bpm): 169   Movement: Present     General:  Alert, oriented and cooperative. Patient is in no acute distress.  Skin: Skin is warm and dry. No rash noted.   Cardiovascular: Normal heart rate noted  Respiratory: Normal respiratory effort, no problems with respiration noted  Abdomen: Soft, gravid, appropriate for gestational age. Pain/Pressure: Absent     Pelvic:  Cervical exam deferred        Extremities: Normal range of motion.     Mental Status: Normal mood and affect. Normal behavior. Normal judgment and thought content.    Assessment   36 y.o. [id number] at [redacted]w[redacted]d by  09/24/2018, by Last Menstrual Period presenting for routine prenatal visit  Plan   pregnancy4 Problems (from 12/18/17 to present)    No problems associated with this episode.       Preterm labor symptoms and general obstetric precautions including but not limited to vaginal bleeding, contractions, leaking of fluid and fetal movement were reviewed in detail with the patient. Please refer to After Visit Summary for other counseling recommendations.  Rx sent for 17P  Return in about 1 month (around 04/08/2018) for rob.  Rod Can, CNM 03/11/2018 4:05 PM

## 2018-03-11 NOTE — Progress Notes (Signed)
ROB

## 2018-03-11 NOTE — Patient Instructions (Signed)

## 2018-03-16 LAB — MATERNIT 21 PLUS CORE, BLOOD
CHROMOSOME 18: NEGATIVE
Chromosome 13: NEGATIVE
Chromosome 21: NEGATIVE
Y Chromosome: NOT DETECTED

## 2018-03-22 ENCOUNTER — Telehealth: Payer: Self-pay

## 2018-03-22 NOTE — Telephone Encounter (Signed)
Pt inquiring about MaterniT21 results. Cb#[id number]

## 2018-04-08 ENCOUNTER — Ambulatory Visit (INDEPENDENT_AMBULATORY_CARE_PROVIDER_SITE_OTHER): Payer: Medicaid Other | Admitting: Maternal Newborn

## 2018-04-08 ENCOUNTER — Encounter: Payer: Self-pay | Admitting: Maternal Newborn

## 2018-04-08 VITALS — BP 102/70 | Wt 201.0 lb

## 2018-04-08 DIAGNOSIS — O09522 Supervision of elderly multigravida, second trimester: Secondary | ICD-10-CM

## 2018-04-08 DIAGNOSIS — O09212 Supervision of pregnancy with history of pre-term labor, second trimester: Secondary | ICD-10-CM

## 2018-04-08 DIAGNOSIS — Z3A15 15 weeks gestation of pregnancy: Secondary | ICD-10-CM

## 2018-04-08 DIAGNOSIS — Z8751 Personal history of pre-term labor: Secondary | ICD-10-CM

## 2018-04-08 DIAGNOSIS — O09899 Supervision of other high risk pregnancies, unspecified trimester: Secondary | ICD-10-CM | POA: Insufficient documentation

## 2018-04-08 DIAGNOSIS — O0991 Supervision of high risk pregnancy, unspecified, first trimester: Secondary | ICD-10-CM

## 2018-04-08 DIAGNOSIS — O09219 Supervision of pregnancy with history of pre-term labor, unspecified trimester: Secondary | ICD-10-CM

## 2018-04-08 MED ORDER — HYDROXYPROGESTERONE CAPROATE 250 MG/ML IM OIL: 250.0000 mg | TOPICAL_OIL | Freq: Once | INTRAMUSCULAR | 18 refills | Status: DC

## 2018-04-08 MED ORDER — HYDROXYPROGESTERONE CAPROATE 250 MG/ML IM OIL
250.0000 mg | TOPICAL_OIL | Freq: Once | INTRAMUSCULAR | Status: AC
  Administered 2018-04-08: 250 mg via INTRAMUSCULAR

## 2018-04-08 MED ORDER — HYDROXYPROGESTERONE CAPROATE 250 MG/ML IM OIL: 250.0000 mg | TOPICAL_OIL | Freq: Once | INTRAMUSCULAR | 0 refills | Status: DC

## 2018-04-08 NOTE — Patient Instructions (Signed)

## 2018-04-08 NOTE — Progress Notes (Signed)
    Routine Prenatal Care Visit  Subjective  Julia Gay is a 37 y.o. [phone] at [redacted]w[redacted]d being seen today for ongoing prenatal care.  She is currently monitored for the following issues for this high-risk pregnancy and has Obsessive compulsive disorder; Preventative health care; AMA (advanced maternal age) multigravida 84+, first trimester; Vitamin D deficiency; Supervision of high risk pregnancy, antepartum, first trimester; and History of pre-eclampsia in prior pregnancy, currently pregnant in first trimester on their problem list.  ----------------------------------------------------------------------------------- Patient reports pelvic pressure when she is on her feet a lot and occasional involuntary urine loss. No dysuria or urgency. Vag. Bleeding: None.  Movement: Present. No leaking of fluid.  ----------------------------------------------------------------------------------- The following portions of the patient's history were reviewed and updated as appropriate: allergies, current medications, past family history, past medical history, past social history, past surgical history and problem list. Problem list updated.  Objective  Blood pressure 102/70, weight 201 lb (91.2 kg), last menstrual period 12/18/2017. Body mass index is 34.5 kg/m. Pregravid weight 193 lb (87.5 kg) Total Weight Gain 8 lb (3.629 kg) Urinalysis: Urine Protein: Negative Urine Glucose: Negative  Fetal Status: Fetal Heart Rate (bpm): 160   Movement: Present     General:  Alert, oriented and cooperative. Patient is in no acute distress.  Skin: Skin is warm and dry. No rash noted.   Cardiovascular: Normal heart rate noted  Respiratory: Normal respiratory effort, no problems with respiration noted  Abdomen: Soft, gravid, appropriate for gestational age. Pain/Pressure: Present     Pelvic:  Cervical exam deferred        Extremities: Normal range of motion.     Mental Status: Normal mood and affect. Normal behavior.  Normal judgment and thought content.    Assessment   37 y.o. [id number] at [redacted]w[redacted]d, EDD 09/24/2018 by Last Menstrual Period presenting for routine prenatal visit.  Plan   pregnancy4 Problems (from 12/18/17 to present)    Problem Noted Resolved   Supervision of high risk pregnancy, antepartum, first trimester 02/14/2018 by Gae Dry, MD No   Overview Signed 04/08/2018  3:38 PM by Rexene Agent, [place] Prenatal Labs  Dating  Blood type: A/Positive/-- (06/10 1025)   Genetic Screen 1 Screen:    AFP:     Quad:     NIPS: Antibody:Negative (06/10 1025)  Anatomic [place]  Rubella: 3.49 (06/10 1025) Varicella: Immune  GTT Early:               Third trimester:  RPR: Non Reactive (06/10 1025)   Rhogam  HBsAg: Negative (06/10 1025)   TDaP vaccine                       Flu Shot: HIV: Non Reactive (06/10 1025)   Baby Food                                GBS:   Contraception  Pap:  CBB     CS/VBAC    Support Person               Makena injections beginning today.   Gestational age appropriate obstetric precautions were reviewed.  Please refer to After Visit Summary for other counseling recommendations.   Return in about 1 month (around 05/06/2018) for ROB and anatomy scan.  Avel Sensor, CNM 04/08/2018  3:58 PM

## 2018-04-15 ENCOUNTER — Ambulatory Visit (INDEPENDENT_AMBULATORY_CARE_PROVIDER_SITE_OTHER): Payer: Medicaid Other

## 2018-04-15 DIAGNOSIS — O09212 Supervision of pregnancy with history of pre-term labor, second trimester: Secondary | ICD-10-CM | POA: Diagnosis not present

## 2018-04-15 DIAGNOSIS — Z3A16 16 weeks gestation of pregnancy: Secondary | ICD-10-CM | POA: Diagnosis not present

## 2018-04-15 DIAGNOSIS — O09899 Supervision of other high risk pregnancies, unspecified trimester: Secondary | ICD-10-CM

## 2018-04-15 DIAGNOSIS — O09219 Supervision of pregnancy with history of pre-term labor, unspecified trimester: Principal | ICD-10-CM

## 2018-04-15 MED ORDER — HYDROXYPROGESTERONE CAPROATE 250 MG/ML IM OIL
250.0000 mg | TOPICAL_OIL | Freq: Once | INTRAMUSCULAR | Status: AC
  Administered 2018-04-15: 250 mg via INTRAMUSCULAR

## 2018-04-22 ENCOUNTER — Ambulatory Visit: Payer: Medicaid Other

## 2018-04-25 ENCOUNTER — Ambulatory Visit (INDEPENDENT_AMBULATORY_CARE_PROVIDER_SITE_OTHER): Payer: Medicaid Other

## 2018-04-25 DIAGNOSIS — O09219 Supervision of pregnancy with history of pre-term labor, unspecified trimester: Principal | ICD-10-CM

## 2018-04-25 DIAGNOSIS — Z3A17 17 weeks gestation of pregnancy: Secondary | ICD-10-CM

## 2018-04-25 DIAGNOSIS — O09212 Supervision of pregnancy with history of pre-term labor, second trimester: Secondary | ICD-10-CM | POA: Diagnosis not present

## 2018-04-25 DIAGNOSIS — O09899 Supervision of other high risk pregnancies, unspecified trimester: Secondary | ICD-10-CM

## 2018-04-25 MED ORDER — HYDROXYPROGESTERONE CAPROATE 250 MG/ML IM OIL
250.0000 mg | TOPICAL_OIL | Freq: Once | INTRAMUSCULAR | Status: AC
  Administered 2018-04-25: 250 mg via INTRAMUSCULAR

## 2018-04-29 ENCOUNTER — Ambulatory Visit: Payer: Medicaid Other

## 2018-04-29 ENCOUNTER — Other Ambulatory Visit: Payer: Self-pay | Admitting: Obstetrics & Gynecology

## 2018-04-29 DIAGNOSIS — O0992 Supervision of high risk pregnancy, unspecified, second trimester: Secondary | ICD-10-CM

## 2018-05-02 ENCOUNTER — Ambulatory Visit (INDEPENDENT_AMBULATORY_CARE_PROVIDER_SITE_OTHER): Payer: Medicaid Other

## 2018-05-02 DIAGNOSIS — O09212 Supervision of pregnancy with history of pre-term labor, second trimester: Secondary | ICD-10-CM

## 2018-05-02 DIAGNOSIS — Z3A18 18 weeks gestation of pregnancy: Secondary | ICD-10-CM | POA: Diagnosis not present

## 2018-05-02 DIAGNOSIS — Z8751 Personal history of pre-term labor: Secondary | ICD-10-CM

## 2018-05-02 MED ORDER — HYDROXYPROGESTERONE CAPROATE 250 MG/ML IM OIL
250.0000 mg | TOPICAL_OIL | Freq: Once | INTRAMUSCULAR | Status: AC
  Administered 2018-05-02: 250 mg via INTRAMUSCULAR

## 2018-05-06 ENCOUNTER — Ambulatory Visit (INDEPENDENT_AMBULATORY_CARE_PROVIDER_SITE_OTHER): Payer: Medicaid Other

## 2018-05-06 ENCOUNTER — Ambulatory Visit (INDEPENDENT_AMBULATORY_CARE_PROVIDER_SITE_OTHER): Payer: Medicaid Other | Admitting: Obstetrics & Gynecology

## 2018-05-06 VITALS — BP 120/80 | Wt 203.0 lb

## 2018-05-06 DIAGNOSIS — O0992 Supervision of high risk pregnancy, unspecified, second trimester: Secondary | ICD-10-CM

## 2018-05-06 DIAGNOSIS — Z23 Encounter for immunization: Secondary | ICD-10-CM

## 2018-05-06 DIAGNOSIS — O09899 Supervision of other high risk pregnancies, unspecified trimester: Secondary | ICD-10-CM

## 2018-05-06 DIAGNOSIS — O09212 Supervision of pregnancy with history of pre-term labor, second trimester: Secondary | ICD-10-CM

## 2018-05-06 DIAGNOSIS — O09219 Supervision of pregnancy with history of pre-term labor, unspecified trimester: Secondary | ICD-10-CM

## 2018-05-06 DIAGNOSIS — Z362 Encounter for other antenatal screening follow-up: Secondary | ICD-10-CM

## 2018-05-06 DIAGNOSIS — Z363 Encounter for antenatal screening for malformations: Secondary | ICD-10-CM | POA: Diagnosis not present

## 2018-05-06 DIAGNOSIS — O09292 Supervision of pregnancy with other poor reproductive or obstetric history, second trimester: Secondary | ICD-10-CM

## 2018-05-06 DIAGNOSIS — Z3A19 19 weeks gestation of pregnancy: Secondary | ICD-10-CM

## 2018-05-06 DIAGNOSIS — O09522 Supervision of elderly multigravida, second trimester: Secondary | ICD-10-CM

## 2018-05-06 DIAGNOSIS — O0991 Supervision of high risk pregnancy, unspecified, first trimester: Secondary | ICD-10-CM

## 2018-05-06 DIAGNOSIS — O09521 Supervision of elderly multigravida, first trimester: Secondary | ICD-10-CM

## 2018-05-06 LAB — POCT URINALYSIS DIPSTICK OB
GLUCOSE, UA: NEGATIVE
POC,PROTEIN,UA: NEGATIVE

## 2018-05-06 NOTE — Patient Instructions (Signed)

## 2018-05-06 NOTE — Progress Notes (Signed)
  Subjective  Fetal Movement? yes Contractions? no Leaking Fluid? no Vaginal Bleeding? no  Objective  BP 120/80   Wt 203 lb (92.1 kg)   LMP 12/18/2017   BMI 34.84 kg/m  General: NAD Pumonary: no increased work of breathing Abdomen: gravid, non-tender Extremities: no edema Psychiatric: mood appropriate, affect full  Assessment  37 y.o. Gay at [redacted]w[redacted]d by  09/24/2018, by Last Menstrual Period presenting for routine prenatal visit  Plan   Problem List Items Addressed This Visit      Other   AMA (advanced maternal age) multigravida 35+, first trimester   Supervision of high risk pregnancy, antepartum, first trimester   History of pre-eclampsia in prior pregnancy, currently pregnant in first trimester   History of preterm delivery, currently pregnant, unspecified trimester    Other Visit Diagnoses    [redacted] weeks gestation of pregnancy    -  Primary   Relevant Orders   POC Urinalysis Dipstick OB (Completed)   Need for immunization against influenza       Relevant Orders   Flu Vaccine QUAD 36+ mos IM (Completed)   Encounter for other antenatal screening follow-up       Relevant Orders   US OB Follow Up    Review of ULTRASOUND. I have personally reviewed images and report of recent ultrasound done at [facility]. There is a singleton gestation with subjectively normal amniotic fluid volume. The fetal biometry correlates with established dating. Detailed evaluation of the fetal anatomy was performed.The fetal anatomical survey appears within normal limits within the resolution of ultrasound as described above.  It must be noted that a normal ultrasound is unable to rule out fetal aneuploidy.    Cont Makena weekly (Mondays usually)  Barnett Applebaum, MD, [facility], [facility] 05/06/2018  4:49 PM

## 2018-05-06 NOTE — Progress Notes (Signed)
[id number]  

## 2018-05-10 ENCOUNTER — Ambulatory Visit (INDEPENDENT_AMBULATORY_CARE_PROVIDER_SITE_OTHER): Payer: Medicaid Other

## 2018-05-10 DIAGNOSIS — Z3A19 19 weeks gestation of pregnancy: Secondary | ICD-10-CM | POA: Diagnosis not present

## 2018-05-10 DIAGNOSIS — O09212 Supervision of pregnancy with history of pre-term labor, second trimester: Secondary | ICD-10-CM | POA: Diagnosis not present

## 2018-05-10 DIAGNOSIS — Z8751 Personal history of pre-term labor: Secondary | ICD-10-CM

## 2018-05-10 MED ORDER — HYDROXYPROGESTERONE CAPROATE 250 MG/ML IM OIL
250.0000 mg | TOPICAL_OIL | Freq: Once | INTRAMUSCULAR | Status: AC
  Administered 2018-05-10: 250 mg via INTRAMUSCULAR

## 2018-05-16 ENCOUNTER — Ambulatory Visit: Payer: Medicaid Other

## 2018-05-16 ENCOUNTER — Ambulatory Visit (INDEPENDENT_AMBULATORY_CARE_PROVIDER_SITE_OTHER): Payer: Medicaid Other

## 2018-05-16 DIAGNOSIS — Z3A20 20 weeks gestation of pregnancy: Secondary | ICD-10-CM | POA: Diagnosis not present

## 2018-05-16 DIAGNOSIS — Z8751 Personal history of pre-term labor: Secondary | ICD-10-CM

## 2018-05-16 DIAGNOSIS — O09212 Supervision of pregnancy with history of pre-term labor, second trimester: Secondary | ICD-10-CM | POA: Diagnosis not present

## 2018-05-16 MED ORDER — HYDROXYPROGESTERONE CAPROATE 250 MG/ML IM OIL
250.0000 mg | TOPICAL_OIL | Freq: Once | INTRAMUSCULAR | Status: AC
  Administered 2018-05-16: 250 mg via INTRAMUSCULAR

## 2018-05-23 ENCOUNTER — Ambulatory Visit (INDEPENDENT_AMBULATORY_CARE_PROVIDER_SITE_OTHER): Payer: Medicaid Other

## 2018-05-23 DIAGNOSIS — O09892 Supervision of other high risk pregnancies, second trimester: Secondary | ICD-10-CM

## 2018-05-23 DIAGNOSIS — Z3A21 21 weeks gestation of pregnancy: Secondary | ICD-10-CM

## 2018-05-23 DIAGNOSIS — O09212 Supervision of pregnancy with history of pre-term labor, second trimester: Secondary | ICD-10-CM

## 2018-05-23 MED ORDER — HYDROXYPROGESTERONE CAPROATE 250 MG/ML IM OIL
250.0000 mg | TOPICAL_OIL | Freq: Once | INTRAMUSCULAR | Status: AC
  Administered 2018-05-23: 250 mg via INTRAMUSCULAR

## 2018-05-23 NOTE — Progress Notes (Signed)
Pt here for hydroxyprogesterone 250mg /ml which was given IM left glut.  NDC# [phone]

## 2018-05-30 ENCOUNTER — Other Ambulatory Visit: Payer: Self-pay | Admitting: Advanced Practice Midwife

## 2018-05-30 ENCOUNTER — Ambulatory Visit (INDEPENDENT_AMBULATORY_CARE_PROVIDER_SITE_OTHER): Payer: Medicaid Other | Admitting: Advanced Practice Midwife

## 2018-05-30 ENCOUNTER — Ambulatory Visit (INDEPENDENT_AMBULATORY_CARE_PROVIDER_SITE_OTHER): Payer: Medicaid Other

## 2018-05-30 VITALS — BP 102/70 | Wt 209.0 lb

## 2018-05-30 DIAGNOSIS — Z3A23 23 weeks gestation of pregnancy: Secondary | ICD-10-CM | POA: Diagnosis not present

## 2018-05-30 DIAGNOSIS — O09212 Supervision of pregnancy with history of pre-term labor, second trimester: Secondary | ICD-10-CM

## 2018-05-30 DIAGNOSIS — Z131 Encounter for screening for diabetes mellitus: Secondary | ICD-10-CM

## 2018-05-30 DIAGNOSIS — Z8751 Personal history of pre-term labor: Secondary | ICD-10-CM

## 2018-05-30 DIAGNOSIS — Z362 Encounter for other antenatal screening follow-up: Secondary | ICD-10-CM | POA: Diagnosis not present

## 2018-05-30 DIAGNOSIS — Z130 Encounter for screening for diseases of the blood and blood-forming organs and certain disorders involving the immune mechanism: Secondary | ICD-10-CM

## 2018-05-30 DIAGNOSIS — O0991 Supervision of high risk pregnancy, unspecified, first trimester: Secondary | ICD-10-CM

## 2018-05-30 DIAGNOSIS — Z113 Encounter for screening for infections with a predominantly sexual mode of transmission: Secondary | ICD-10-CM

## 2018-05-30 MED ORDER — HYDROXYPROGESTERONE CAPROATE 250 MG/ML IM OIL
250.0000 mg | TOPICAL_OIL | Freq: Once | INTRAMUSCULAR | Status: AC
  Administered 2018-05-30: 250 mg via INTRAMUSCULAR

## 2018-05-30 NOTE — Progress Notes (Signed)
Routine Prenatal Care Visit  Subjective  Julia Gay is a 37 y.o. [phone] at [redacted]w[redacted]d being seen today for ongoing prenatal care.  She is currently monitored for the following issues for this high-risk pregnancy and has Obsessive compulsive disorder; Preventative health care; AMA (advanced maternal age) multigravida 31+, first trimester; Vitamin D deficiency; Supervision of high risk pregnancy, antepartum, first trimester; History of pre-eclampsia in prior pregnancy, currently pregnant in first trimester; and History of preterm delivery, currently pregnant, unspecified trimester on their problem list.  ----------------------------------------------------------------------------------- Patient reports no complaints.  Discussed f/u anatomy scan. Referral sent to MFM to f/u for sub-optimal/abnormal view of RVOT  . Vag. Bleeding: None.  Movement: Present. Denies leaking of fluid.  ----------------------------------------------------------------------------------- The following portions of the patient's history were reviewed and updated as appropriate: allergies, current medications, past family history, past medical history, past social history, past surgical history and problem list. Problem list updated.   Objective  Blood pressure 102/70, weight 209 lb (94.8 kg), last menstrual period 12/18/2017. Pregravid weight 193 lb (87.5 kg) Total Weight Gain 16 lb (7.258 kg) Urinalysis: Urine Protein    Urine Glucose    Fetal Status: Fetal Heart Rate (bpm): 154   Movement: Present      Patient Name: Julia Gay DOB: 1981-08-30 MRN: [id number]  ULTRASOUND REPORT  Location: [facility] Date of Service: 05/30/2018   Indications: Anatomy follow up ultrasound Findings:  Julia Gay intrauterine pregnancy is visualized with FHR at 154 BPM.  Fetal presentation is Breech.  Placenta: fundal. Grade: 0, PCI is peripheral (2.87cm) from the upper margin of the main lobe of the placenta , which has an  accessory lobe posteriorly  AFI: subjectively normal.  Anatomic survey is complete for the face,nose/lips,PCI,4CH,LVOT but is either suboptimals or abnormal for the RVOT (may need a referral to MFM)   There is no free peritoneal fluid in the cul de sac.  Impression: 1. [redacted]w[redacted]d Viable Singleton Intrauterine pregnancy previously established criteria. 2. Normal Anatomy Scan is complete for the face,nose/lips,PCI,4CH,LVOT but is either suboptimals or abnormal for the RVOT (may need a referral to MFM)  3. Placenta: fundal. Grade: 0, PCI is peripheral (2.87cm) from the upper margin of the main lobe of the placenta , which has an accessory lobe posteriorly    Recommendations: 1.Clinical correlation with the patient's History and Physical Exam.   General:  Alert, oriented and cooperative. Patient is in no acute distress.  Skin: Skin is warm and dry. No rash noted.   Cardiovascular: Normal heart rate noted  Respiratory: Normal respiratory effort, no problems with respiration noted  Abdomen: Soft, gravid, appropriate for gestational age. Pain/Pressure: Absent     Pelvic:  Cervical exam deferred        Extremities: Normal range of motion.  Edema: None  Mental Status: Normal mood and affect. Normal behavior. Normal judgment and thought content.   Assessment   37 y.o. [id number] at [redacted]w[redacted]d by  09/24/2018, by Last Menstrual Period presenting for routine prenatal visit  Plan   pregnancy4 Problems (from 12/18/17 to present)    Problem Noted Resolved   Supervision of high risk pregnancy, antepartum, first trimester 02/14/2018 by Gae Dry, MD No   Overview Signed 04/08/2018  3:38 PM by Rexene Agent, [place] Prenatal Labs  Dating  Blood type: A/Positive/-- (06/10 1025)   Genetic Screen 1 Screen:    AFP:     Quad:     NIPS: Antibody:Negative (06/10 1025)  Anatomic [place]  Rubella: 3.49 (06/10 1025) Varicella: Immune  GTT Early:               Third trimester:  RPR: Non Reactive  (06/10 1025)   Rhogam  HBsAg: Negative (06/10 1025)   TDaP vaccine                       Flu Shot: HIV: Non Reactive (06/10 1025)   Baby Food                                GBS:   Contraception  Pap:  CBB     CS/VBAC    Support Person                  Preterm labor symptoms and general obstetric precautions including but not limited to vaginal bleeding, contractions, leaking of fluid and fetal movement were reviewed in detail with the patient.   Return in about 4 weeks (around 06/27/2018) for 28 wk labs and rob.  Rod Can, CNM 05/30/2018 9:41 AM

## 2018-06-03 ENCOUNTER — Telehealth: Payer: Self-pay

## 2018-06-03 NOTE — Telephone Encounter (Signed)
Pt is scheduled to have a wisdom tooth pulled Monday.  Is it okay at 24wks?  [phone]

## 2018-06-03 NOTE — Telephone Encounter (Signed)
Left message for patient regarding it is ok to have dental work done on Monday.

## 2018-06-06 ENCOUNTER — Ambulatory Visit: Payer: Medicaid Other

## 2018-06-07 ENCOUNTER — Ambulatory Visit (INDEPENDENT_AMBULATORY_CARE_PROVIDER_SITE_OTHER): Payer: Medicaid Other

## 2018-06-07 DIAGNOSIS — O09212 Supervision of pregnancy with history of pre-term labor, second trimester: Secondary | ICD-10-CM | POA: Diagnosis not present

## 2018-06-07 DIAGNOSIS — Z3A24 24 weeks gestation of pregnancy: Secondary | ICD-10-CM | POA: Diagnosis not present

## 2018-06-07 DIAGNOSIS — O09892 Supervision of other high risk pregnancies, second trimester: Secondary | ICD-10-CM

## 2018-06-07 MED ORDER — HYDROXYPROGESTERONE CAPROATE 250 MG/ML IM OIL
250.0000 mg | TOPICAL_OIL | Freq: Once | INTRAMUSCULAR | Status: AC
  Administered 2018-06-07: 250 mg via INTRAMUSCULAR

## 2018-06-07 NOTE — Progress Notes (Signed)
Injection given in R upper outer quadrant per patient request as her left upper outer quadrant is still irritated from a previous injection. She is having mild urticaria with some injections at the injection site.

## 2018-06-09 ENCOUNTER — Ambulatory Visit
Admission: RE | Admit: 2018-06-09 | Discharge: 2018-06-09 | Disposition: A | Discharge status: Home / Self Care | Payer: Medicaid Other | Source: Ambulatory Visit | Attending: Obstetrics and Gynecology | Admitting: Obstetrics and Gynecology

## 2018-06-09 ENCOUNTER — Ambulatory Visit
Admission: RE | Admit: 2018-06-09 | Discharge: 2018-06-09 | Disposition: A | Discharge status: Home / Self Care | Payer: Medicaid Other | Source: Ambulatory Visit | Attending: Advanced Practice Midwife | Admitting: Advanced Practice Midwife

## 2018-06-09 DIAGNOSIS — O0991 Supervision of high risk pregnancy, unspecified, first trimester: Secondary | ICD-10-CM

## 2018-06-09 DIAGNOSIS — Z3A24 24 weeks gestation of pregnancy: Secondary | ICD-10-CM | POA: Diagnosis not present

## 2018-06-09 DIAGNOSIS — O0992 Supervision of high risk pregnancy, unspecified, second trimester: Secondary | ICD-10-CM | POA: Insufficient documentation

## 2018-06-13 ENCOUNTER — Ambulatory Visit (INDEPENDENT_AMBULATORY_CARE_PROVIDER_SITE_OTHER): Payer: Medicaid Other

## 2018-06-13 DIAGNOSIS — Z3A22 22 weeks gestation of pregnancy: Secondary | ICD-10-CM

## 2018-06-13 DIAGNOSIS — O09892 Supervision of other high risk pregnancies, second trimester: Secondary | ICD-10-CM

## 2018-06-13 DIAGNOSIS — O09212 Supervision of pregnancy with history of pre-term labor, second trimester: Secondary | ICD-10-CM | POA: Diagnosis not present

## 2018-06-13 MED ORDER — HYDROXYPROGESTERONE CAPROATE 250 MG/ML IM OIL
250.0000 mg | TOPICAL_OIL | Freq: Once | INTRAMUSCULAR | Status: AC
  Administered 2018-06-13: 250 mg via INTRAMUSCULAR

## 2018-06-13 NOTE — Progress Notes (Signed)
Pt is here for hydroxyprogesterone inj which was given IM left glut.  NDC# [phone].

## 2018-06-20 ENCOUNTER — Ambulatory Visit (INDEPENDENT_AMBULATORY_CARE_PROVIDER_SITE_OTHER): Payer: Medicaid Other

## 2018-06-20 DIAGNOSIS — Z3A23 23 weeks gestation of pregnancy: Secondary | ICD-10-CM

## 2018-06-20 DIAGNOSIS — O09212 Supervision of pregnancy with history of pre-term labor, second trimester: Secondary | ICD-10-CM

## 2018-06-20 DIAGNOSIS — O09892 Supervision of other high risk pregnancies, second trimester: Secondary | ICD-10-CM

## 2018-06-20 MED ORDER — HYDROXYPROGESTERONE CAPROATE 250 MG/ML IM OIL
250.0000 mg | TOPICAL_OIL | Freq: Once | INTRAMUSCULAR | Status: AC
  Administered 2018-06-20: 250 mg via INTRAMUSCULAR

## 2018-06-27 ENCOUNTER — Ambulatory Visit (INDEPENDENT_AMBULATORY_CARE_PROVIDER_SITE_OTHER): Payer: Medicaid Other | Admitting: Obstetrics and Gynecology

## 2018-06-27 ENCOUNTER — Other Ambulatory Visit: Payer: Medicaid Other

## 2018-06-27 ENCOUNTER — Encounter: Payer: Self-pay | Admitting: Obstetrics and Gynecology

## 2018-06-27 ENCOUNTER — Encounter: Payer: Medicaid Other | Admitting: Maternal Newborn

## 2018-06-27 VITALS — BP 110/60 | Wt 214.0 lb

## 2018-06-27 DIAGNOSIS — Z130 Encounter for screening for diseases of the blood and blood-forming organs and certain disorders involving the immune mechanism: Secondary | ICD-10-CM

## 2018-06-27 DIAGNOSIS — O0991 Supervision of high risk pregnancy, unspecified, first trimester: Secondary | ICD-10-CM

## 2018-06-27 DIAGNOSIS — O09291 Supervision of pregnancy with other poor reproductive or obstetric history, first trimester: Secondary | ICD-10-CM

## 2018-06-27 DIAGNOSIS — Z131 Encounter for screening for diabetes mellitus: Secondary | ICD-10-CM

## 2018-06-27 DIAGNOSIS — R300 Dysuria: Secondary | ICD-10-CM

## 2018-06-27 DIAGNOSIS — O09212 Supervision of pregnancy with history of pre-term labor, second trimester: Secondary | ICD-10-CM

## 2018-06-27 DIAGNOSIS — Z3A27 27 weeks gestation of pregnancy: Secondary | ICD-10-CM

## 2018-06-27 DIAGNOSIS — Z113 Encounter for screening for infections with a predominantly sexual mode of transmission: Secondary | ICD-10-CM

## 2018-06-27 DIAGNOSIS — O09522 Supervision of elderly multigravida, second trimester: Secondary | ICD-10-CM

## 2018-06-27 DIAGNOSIS — O09292 Supervision of pregnancy with other poor reproductive or obstetric history, second trimester: Secondary | ICD-10-CM

## 2018-06-27 DIAGNOSIS — O09219 Supervision of pregnancy with history of pre-term labor, unspecified trimester: Secondary | ICD-10-CM

## 2018-06-27 DIAGNOSIS — O09899 Supervision of other high risk pregnancies, unspecified trimester: Secondary | ICD-10-CM

## 2018-06-27 DIAGNOSIS — Z8751 Personal history of pre-term labor: Secondary | ICD-10-CM

## 2018-06-27 LAB — POCT URINALYSIS DIPSTICK
Bilirubin, UA: NEGATIVE
Glucose, UA: NEGATIVE
KETONES UA: NEGATIVE
NITRITE UA: NEGATIVE
PH UA: 7.5 (ref 5.0–8.0)
PROTEIN UA: NEGATIVE
RBC UA: NEGATIVE
Spec Grav, UA: 1.02 (ref 1.010–1.025)
Urobilinogen, UA: 0.2 E.U./dL

## 2018-06-27 MED ORDER — HYDROXYPROGESTERONE CAPROATE 250 MG/ML IM OIL
250.0000 mg | TOPICAL_OIL | Freq: Once | INTRAMUSCULAR | Status: AC
  Administered 2018-06-27: 250 mg via INTRAMUSCULAR

## 2018-06-27 NOTE — Progress Notes (Signed)
ROB/GTT Makena today  C/o burning in vaginal area for the last few days

## 2018-06-27 NOTE — Progress Notes (Signed)
Routine Prenatal Care Visit  Subjective  Julia Gay is a 37 y.o. [phone] at [redacted]w[redacted]d being seen today for ongoing prenatal care.  She is currently monitored for the following issues for this high-risk pregnancy and has Obsessive compulsive disorder; Preventative health care; AMA (advanced maternal age) multigravida 36+, first trimester; Vitamin D deficiency; Supervision of high risk pregnancy, antepartum, first trimester; History of pre-eclampsia in prior pregnancy, currently pregnant in first trimester; and History of preterm delivery, currently pregnant, unspecified trimester on their problem list.  ----------------------------------------------------------------------------------- Patient reports no complaints.   Contractions: Not present. Vag. Bleeding: None.  Movement: Present. Denies leaking of fluid.  ----------------------------------------------------------------------------------- The following portions of the patient's history were reviewed and updated as appropriate: allergies, current medications, past family history, past medical history, past social history, past surgical history and problem list. Problem list updated.   Objective  Blood pressure 110/60, weight 214 lb (97.1 kg), last menstrual period 12/18/2017. Pregravid weight 193 lb (87.5 kg) Total Weight Gain 21 lb (9.526 kg) Urinalysis:      Fetal Status: Fetal Heart Rate (bpm): 130 Fundal Height: 32 cm Movement: Present     General:  Alert, oriented and cooperative. Patient is in no acute distress.  Skin: Skin is warm and dry. No rash noted.   Cardiovascular: Normal heart rate noted  Respiratory: Normal respiratory effort, no problems with respiration noted  Abdomen: Soft, gravid, appropriate for gestational age. Pain/Pressure: Absent     Pelvic:  Cervical exam deferred        Extremities: Normal range of motion.     ental Status: Normal mood and affect. Normal behavior. Normal judgment and thought content.      Assessment   37 y.o. [id number] at [redacted]w[redacted]d by  09/24/2018, by Last Menstrual Period presenting for routine prenatal visit  Plan   pregnancy4 Problems (from 12/18/17 to present)    Problem Noted Resolved   Supervision of high risk pregnancy, antepartum, first trimester 02/14/2018 by Gae Dry, MD No   Overview Addendum 06/27/2018  9:27 AM by Homero Fellers, MD    Clinic [facility] Prenatal Labs  Dating  Blood type: A/Positive/-- (06/10 1025)   Genetic Screen  NIPS: normal XX Antibody:Negative (06/10 1025)  Anatomic [place]  Rubella: 3.49 (06/10 1025)   Varicella: Immune  GTT Early:               Third trimester:  RPR: Non Reactive (06/10 1025)   Rhogam  not needed HBsAg: Negative (06/10 1025)   TDaP vaccine                        Flu Shot: 05/06/18 HIV: Non Reactive (06/10 1025)   Baby Food    bottle                            GBS:   Contraception  undecided- given info Pap: v2017 NIL  CBB     CS/VBAC    Support Person                  Gestational age appropriate obstetric precautions including but not limited to vaginal bleeding, contractions, leaking of fluid and fetal movement were reviewed in detail with the patient.    28 week labs today Urine culture sent Plan growth [place] at 32 weeks- hx of macrosmic baby  Return in about 2 weeks (around 07/11/2018) for ROB.  Adrian Prows MD  [place] 06/27/18 9:30 AM

## 2018-06-28 LAB — 28 WEEK RH+PANEL
BASOS: 0 %
Basophils Absolute: 0 10*3/uL (ref 0.0–0.2)
EOS (ABSOLUTE): 0.2 10*3/uL (ref 0.0–0.4)
EOS: 2 %
Gestational Diabetes Screen: 108 mg/dL (ref 65–139)
HIV SCREEN 4TH GENERATION: NONREACTIVE
Hematocrit: 31.7 % — ABNORMAL LOW (ref 34.0–46.6)
Hemoglobin: 10.4 g/dL — ABNORMAL LOW (ref 11.1–15.9)
IMMATURE GRANS (ABS): 0.1 10*3/uL (ref 0.0–0.1)
Immature Granulocytes: 1 %
Lymphocytes Absolute: 1.4 10*3/uL (ref 0.7–3.1)
Lymphs: 14 %
MCH: 28.3 pg (ref 26.6–33.0)
MCHC: 32.8 g/dL (ref 31.5–35.7)
MCV: 86 fL (ref 79–97)
MONOCYTES: 5 %
Monocytes Absolute: 0.5 10*3/uL (ref 0.1–0.9)
Neutrophils Absolute: 7.8 10*3/uL — ABNORMAL HIGH (ref 1.4–7.0)
Neutrophils: 78 %
Platelets: 195 10*3/uL (ref 150–450)
RBC: 3.67 x10E6/uL — AB (ref 3.77–5.28)
RDW: 12.4 % (ref 12.3–15.4)
RPR Ser Ql: NONREACTIVE
WBC: 10 10*3/uL (ref 3.4–10.8)

## 2018-06-29 LAB — URINE CULTURE

## 2018-06-29 NOTE — Progress Notes (Signed)
Negative, Released to mychart 

## 2018-07-04 ENCOUNTER — Ambulatory Visit (INDEPENDENT_AMBULATORY_CARE_PROVIDER_SITE_OTHER): Payer: Medicaid Other

## 2018-07-04 DIAGNOSIS — Z3A28 28 weeks gestation of pregnancy: Secondary | ICD-10-CM

## 2018-07-04 DIAGNOSIS — O09213 Supervision of pregnancy with history of pre-term labor, third trimester: Secondary | ICD-10-CM | POA: Diagnosis not present

## 2018-07-04 DIAGNOSIS — Z8751 Personal history of pre-term labor: Secondary | ICD-10-CM

## 2018-07-04 MED ORDER — HYDROXYPROGESTERONE CAPROATE 250 MG/ML IM OIL
250.0000 mg | TOPICAL_OIL | Freq: Once | INTRAMUSCULAR | Status: AC
  Administered 2018-07-04: 250 mg via INTRAMUSCULAR

## 2018-07-11 ENCOUNTER — Encounter: Payer: Self-pay | Admitting: Advanced Practice Midwife

## 2018-07-11 ENCOUNTER — Ambulatory Visit (INDEPENDENT_AMBULATORY_CARE_PROVIDER_SITE_OTHER): Payer: Medicaid Other | Admitting: Advanced Practice Midwife

## 2018-07-11 VITALS — BP 118/78 | Wt 222.0 lb

## 2018-07-11 DIAGNOSIS — O09293 Supervision of pregnancy with other poor reproductive or obstetric history, third trimester: Secondary | ICD-10-CM

## 2018-07-11 DIAGNOSIS — O0991 Supervision of high risk pregnancy, unspecified, first trimester: Secondary | ICD-10-CM

## 2018-07-11 DIAGNOSIS — Z23 Encounter for immunization: Secondary | ICD-10-CM

## 2018-07-11 DIAGNOSIS — O09213 Supervision of pregnancy with history of pre-term labor, third trimester: Secondary | ICD-10-CM

## 2018-07-11 DIAGNOSIS — Z3A29 29 weeks gestation of pregnancy: Secondary | ICD-10-CM | POA: Diagnosis not present

## 2018-07-11 DIAGNOSIS — Z8751 Personal history of pre-term labor: Secondary | ICD-10-CM

## 2018-07-11 DIAGNOSIS — O09523 Supervision of elderly multigravida, third trimester: Secondary | ICD-10-CM

## 2018-07-11 LAB — POCT URINALYSIS DIPSTICK OB
GLUCOSE, UA: NEGATIVE
POC,PROTEIN,UA: NEGATIVE

## 2018-07-11 MED ORDER — HYDROXYPROGESTERONE CAPROATE 250 MG/ML IM OIL
250.0000 mg | TOPICAL_OIL | Freq: Once | INTRAMUSCULAR | Status: AC
  Administered 2018-07-11: 250 mg via INTRAMUSCULAR

## 2018-07-11 NOTE — Progress Notes (Signed)
Routine Prenatal Care Visit  Subjective  Julia Gay is a 36 y.o. [phone] at [redacted]w[redacted]d being seen today for ongoing prenatal care.  She is currently monitored for the following issues for this high-risk pregnancy and has Obsessive compulsive disorder; Preventative health care; AMA (advanced maternal age) multigravida 58+, first trimester; Vitamin D deficiency; Supervision of high risk pregnancy, antepartum, first trimester; History of pre-eclampsia in prior pregnancy, currently pregnant in first trimester; and History of preterm delivery, currently pregnant, unspecified trimester on their problem list.  ----------------------------------------------------------------------------------- Patient reports no complaints.  She thinks baby is breech and wonders what happens in that case. Will do growth/presentation at next visit. Discussion of ECV if needed.  Contractions: Not present. Vag. Bleeding: None.  Movement: Present. Denies leaking of fluid.  ----------------------------------------------------------------------------------- The following portions of the patient's history were reviewed and updated as appropriate: allergies, current medications, past family history, past medical history, past social history, past surgical history and problem list. Problem list updated.   Objective  Blood pressure 118/78, weight 222 lb (100.7 kg), last menstrual period 12/18/2017. Pregravid weight 193 lb (87.5 kg) Total Weight Gain 29 lb (13.2 kg) Urinalysis: Urine Protein Negative  Urine Glucose Negative  Fetal Status: Fetal Heart Rate (bpm): 150 Fundal Height: 30 cm Movement: Present     General:  Alert, oriented and cooperative. Patient is in no acute distress.  Skin: Skin is warm and dry. No rash noted.   Cardiovascular: Normal heart rate noted  Respiratory: Normal respiratory effort, no problems with respiration noted  Abdomen: Soft, gravid, appropriate for gestational age. Pain/Pressure: Absent   Heart  tones heard in lower left quadrant. Feels vertex on [place].  Pelvic:  Cervical exam deferred        Extremities: Normal range of motion.  Edema: None  Mental Status: Normal mood and affect. Normal behavior. Normal judgment and thought content.   Assessment   37 y.o. [id number] at [redacted]w[redacted]d by  09/24/2018, by Last Menstrual Period presenting for routine prenatal visit  Plan   pregnancy4 Problems (from 12/18/17 to present)    Problem Noted Resolved   Supervision of high risk pregnancy, antepartum, first trimester 02/14/2018 by Gae Dry, MD No   Overview Addendum 06/27/2018  9:27 AM by Homero Fellers, MD    Clinic [facility] Prenatal Labs  Dating  Blood type: A/Positive/-- (06/10 1025)   Genetic Screen  NIPS: normal XX Antibody:Negative (06/10 1025)  Anatomic [place]  Rubella: 3.49 (06/10 1025)   Varicella: Immune  GTT Early:               Third trimester:  RPR: Non Reactive (06/10 1025)   Rhogam  not needed HBsAg: Negative (06/10 1025)   TDaP vaccine                        Flu Shot: 05/06/18 HIV: Non Reactive (06/10 1025)   Baby Food    bottle                            GBS:   Contraception  undecided- given info Pap: v2017 NIL  CBB     CS/VBAC    Support Person                  Preterm labor symptoms and general obstetric precautions including but not limited to vaginal bleeding, contractions, leaking of fluid and fetal movement were reviewed in detail with the patient.  Please refer to After Visit Summary for other counseling recommendations.   Return in about 2 weeks (around 07/25/2018) for growth and rob.  Rod Can, CNM 07/11/2018 8:58 AM

## 2018-07-11 NOTE — Patient Instructions (Signed)
Third Trimester of Pregnancy The third trimester is from week 28 through week 40 (months 7 through 9). The third trimester is a time when the unborn baby (fetus) is growing rapidly. At the end of the ninth month, the fetus is about 20 inches in length and weighs 6-10 pounds. Body changes during your third trimester Your body will continue to go through many changes during pregnancy. The changes vary from woman to woman. During the third trimester:  Your weight will continue to increase. You can expect to gain 25-35 pounds (11-16 kg) by the end of the pregnancy.  You may begin to get stretch marks on your hips, abdomen, and breasts.  You may urinate more often because the fetus is moving lower into your pelvis and pressing on your bladder.  You may develop or continue to have heartburn. This is caused by increased hormones that slow down muscles in the digestive tract.  You may develop or continue to have constipation because increased hormones slow digestion and cause the muscles that push waste through your intestines to relax.  You may develop hemorrhoids. These are swollen veins (varicose veins) in the rectum that can itch or be painful.  You may develop swollen, bulging veins (varicose veins) in your legs.  You may have increased body aches in the pelvis, back, or thighs. This is due to weight gain and increased hormones that are relaxing your joints.  You may have changes in your hair. These can include thickening of your hair, rapid growth, and changes in texture. Some women also have hair loss during or after pregnancy, or hair that feels dry or thin. Your hair will most likely return to normal after your baby is born.  Your breasts will continue to grow and they will continue to become tender. A yellow fluid (colostrum) may leak from your breasts. This is the first milk you are producing for your baby.  Your belly button may stick out.  You may notice more swelling in your hands,  face, or ankles.  You may have increased tingling or numbness in your hands, arms, and legs. The skin on your belly may also feel numb.  You may feel short of breath because of your expanding uterus.  You may have more problems sleeping. This can be caused by the size of your belly, increased need to urinate, and an increase in your body's metabolism.  You may notice the fetus "dropping," or moving lower in your abdomen (lightening).  You may have increased vaginal discharge.  You may notice your joints feel loose and you may have pain around your pelvic bone.  What to expect at prenatal visits You will have prenatal exams every 2 weeks until week 36. Then you will have weekly prenatal exams. During a routine prenatal visit:  You will be weighed to make sure you and the baby are growing normally.  Your blood pressure will be taken.  Your abdomen will be measured to track your baby's growth.  The fetal heartbeat will be listened to.  Any test results from the previous visit will be discussed.  You may have a cervical check near your due date to see if your cervix has softened or thinned (effaced).  You will be tested for Group B streptococcus. This happens between 35 and 37 weeks.  Your health care provider may ask you:  What your birth plan is.  How you are feeling.  If you are feeling the baby move.  If you have had   any abnormal symptoms, such as leaking fluid, bleeding, severe headaches, or abdominal cramping.  If you are using any tobacco products, including cigarettes, chewing tobacco, and electronic cigarettes.  If you have any questions.  Other tests or screenings that may be performed during your third trimester include:  Blood tests that check for low iron levels (anemia).  Fetal testing to check the health, activity level, and growth of the fetus. Testing is done if you have certain medical conditions or if there are problems during the  pregnancy.  Nonstress test (NST). This test checks the health of your baby to make sure there are no signs of problems, such as the baby not getting enough oxygen. During this test, a belt is placed around your belly. The baby is made to move, and its heart rate is monitored during movement.  What is false labor? False labor is a condition in which you feel small, irregular tightenings of the muscles in the womb (contractions) that usually go away with rest, changing position, or drinking water. These are called Braxton Hicks contractions. Contractions may last for hours, days, or even weeks before true labor sets in. If contractions come at regular intervals, become more frequent, increase in intensity, or become painful, you should see your health care provider. What are the signs of labor?  Abdominal cramps.  Regular contractions that start at 10 minutes apart and become stronger and more frequent with time.  Contractions that start on the top of the uterus and spread down to the lower abdomen and back.  Increased pelvic pressure and dull back pain.  A watery or bloody mucus discharge that comes from the vagina.  Leaking of amniotic fluid. This is also known as your "water breaking." It could be a slow trickle or a gush. Let your health care provider know if it has a color or strange odor. If you have any of these signs, call your health care provider right away, even if it is before your due date. Follow these instructions at home: Medicines  Follow your health care provider's instructions regarding medicine use. Specific medicines may be either safe or unsafe to take during pregnancy.  Take a prenatal vitamin that contains at least 600 micrograms (mcg) of folic acid.  If you develop constipation, try taking a stool softener if your health care provider approves. Eating and drinking  Eat a balanced diet that includes fresh fruits and vegetables, whole grains, good sources of protein  such as meat, eggs, or tofu, and low-fat dairy. Your health care provider will help you determine the amount of weight gain that is right for you.  Avoid raw meat and uncooked cheese. These carry germs that can cause birth defects in the baby.  If you have low calcium intake from food, talk to your health care provider about whether you should take a daily calcium supplement.  Eat four or five small meals rather than three large meals a day.  Limit foods that are high in fat and processed sugars, such as fried and sweet foods.  To prevent constipation: ? Drink enough fluid to keep your urine clear or pale yellow. ? Eat foods that are high in fiber, such as fresh fruits and vegetables, whole grains, and beans. Activity  Exercise only as directed by your health care provider. Most women can continue their usual exercise routine during pregnancy. Try to exercise for 30 minutes at least 5 days a week. Stop exercising if you experience uterine contractions.  Avoid heavy   lifting.  Do not exercise in extreme heat or humidity, or at high altitudes.  Wear low-heel, comfortable shoes.  Practice good posture.  You may continue to have sex unless your health care provider tells you otherwise. Relieving pain and discomfort  Take frequent breaks and rest with your legs elevated if you have leg cramps or low back pain.  Take warm sitz baths to soothe any pain or discomfort caused by hemorrhoids. Use hemorrhoid cream if your health care provider approves.  Wear a good support bra to prevent discomfort from breast tenderness.  If you develop varicose veins: ? Wear support pantyhose or compression stockings as told by your healthcare provider. ? Elevate your feet for 15 minutes, 3-4 times a day. Prenatal care  Write down your questions. Take them to your prenatal visits.  Keep all your prenatal visits as told by your health care provider. This is important. Safety  Wear your seat belt at  all times when driving.  Make a list of emergency phone numbers, including numbers for family, friends, the hospital, and police and fire departments. General instructions  Avoid cat litter boxes and soil used by cats. These carry germs that can cause birth defects in the baby. If you have a cat, ask someone to clean the litter box for you.  Do not travel far distances unless it is absolutely necessary and only with the approval of your health care provider.  Do not use hot tubs, steam rooms, or saunas.  Do not drink alcohol.  Do not use any products that contain nicotine or tobacco, such as cigarettes and e-cigarettes. If you need help quitting, ask your health care provider.  Do not use any medicinal herbs or unprescribed drugs. These chemicals affect the formation and growth of the baby.  Do not douche or use tampons or scented sanitary pads.  Do not cross your legs for long periods of time.  To prepare for the arrival of your baby: ? Take prenatal classes to understand, practice, and ask questions about labor and delivery. ? Make a trial run to the hospital. ? Visit the hospital and tour the maternity area. ? Arrange for maternity or paternity leave through employers. ? Arrange for family and friends to take care of pets while you are in the hospital. ? Purchase a rear-facing car seat and make sure you know how to install it in your car. ? Pack your hospital bag. ? Prepare the baby's nursery. Make sure to remove all pillows and stuffed animals from the baby's crib to prevent suffocation.  Visit your dentist if you have not gone during your pregnancy. Use a soft toothbrush to brush your teeth and be gentle when you floss. Contact a health care provider if:  You are unsure if you are in labor or if your water has broken.  You become dizzy.  You have mild pelvic cramps, pelvic pressure, or nagging pain in your abdominal area.  You have lower back pain.  You have persistent  nausea, vomiting, or diarrhea.  You have an unusual or bad smelling vaginal discharge.  You have pain when you urinate. Get help right away if:  Your water breaks before 37 weeks.  You have regular contractions less than 5 minutes apart before 37 weeks.  You have a fever.  You are leaking fluid from your vagina.  You have spotting or bleeding from your vagina.  You have severe abdominal pain or cramping.  You have rapid weight loss or weight gain.    You have shortness of breath with chest pain.  You notice sudden or extreme swelling of your face, hands, ankles, feet, or legs.  Your baby makes fewer than 10 movements in 2 hours.  You have severe headaches that do not go away when you take medicine.  You have vision changes. Summary  The third trimester is from week 28 through week 40, months 7 through 9. The third trimester is a time when the unborn baby (fetus) is growing rapidly.  During the third trimester, your discomfort may increase as you and your baby continue to gain weight. You may have abdominal, leg, and back pain, sleeping problems, and an increased need to urinate.  During the third trimester your breasts will keep growing and they will continue to become tender. A yellow fluid (colostrum) may leak from your breasts. This is the first milk you are producing for your baby.  False labor is a condition in which you feel small, irregular tightenings of the muscles in the womb (contractions) that eventually go away. These are called Braxton Hicks contractions. Contractions may last for hours, days, or even weeks before true labor sets in.  Signs of labor can include: abdominal cramps; regular contractions that start at 10 minutes apart and become stronger and more frequent with time; watery or bloody mucus discharge that comes from the vagina; increased pelvic pressure and dull back pain; and leaking of amniotic fluid. This information is not intended to replace advice  given to you by your health care provider. Make sure you discuss any questions you have with your health care provider. Document Released: 08/18/2001 Document Revised: 01/30/2016 Document Reviewed: 10/25/2012 Elsevier Interactive Patient Education  2017 [employer]  

## 2018-07-11 NOTE — Progress Notes (Signed)
No vb. No lof. 17 P today

## 2018-07-11 NOTE — Addendum Note (Signed)
Addended by: Brien Few on: 07/11/2018 09:09 AM   Modules accepted: Orders

## 2018-07-11 NOTE — Addendum Note (Signed)
Addended by: Brien Few on: 07/11/2018 09:06 AM   Modules accepted: Orders

## 2018-07-18 ENCOUNTER — Ambulatory Visit (INDEPENDENT_AMBULATORY_CARE_PROVIDER_SITE_OTHER): Payer: Medicaid Other

## 2018-07-18 ENCOUNTER — Ambulatory Visit: Payer: Medicaid Other

## 2018-07-18 DIAGNOSIS — O09213 Supervision of pregnancy with history of pre-term labor, third trimester: Secondary | ICD-10-CM

## 2018-07-18 DIAGNOSIS — Z3A30 30 weeks gestation of pregnancy: Secondary | ICD-10-CM | POA: Diagnosis not present

## 2018-07-18 DIAGNOSIS — Z8751 Personal history of pre-term labor: Secondary | ICD-10-CM

## 2018-07-18 MED ORDER — HYDROXYPROGESTERONE CAPROATE 250 MG/ML IM OIL
250.0000 mg | TOPICAL_OIL | Freq: Once | INTRAMUSCULAR | Status: AC
  Administered 2018-07-18: 250 mg via INTRAMUSCULAR

## 2018-07-25 ENCOUNTER — Ambulatory Visit (INDEPENDENT_AMBULATORY_CARE_PROVIDER_SITE_OTHER): Payer: Medicaid Other | Admitting: Advanced Practice Midwife

## 2018-07-25 ENCOUNTER — Encounter: Payer: Self-pay | Admitting: Advanced Practice Midwife

## 2018-07-25 ENCOUNTER — Ambulatory Visit (INDEPENDENT_AMBULATORY_CARE_PROVIDER_SITE_OTHER): Payer: Medicaid Other

## 2018-07-25 VITALS — BP 124/64 | Wt 221.0 lb

## 2018-07-25 DIAGNOSIS — Z362 Encounter for other antenatal screening follow-up: Secondary | ICD-10-CM | POA: Diagnosis not present

## 2018-07-25 DIAGNOSIS — Z3A31 31 weeks gestation of pregnancy: Secondary | ICD-10-CM

## 2018-07-25 DIAGNOSIS — O09213 Supervision of pregnancy with history of pre-term labor, third trimester: Secondary | ICD-10-CM | POA: Diagnosis not present

## 2018-07-25 DIAGNOSIS — O09893 Supervision of other high risk pregnancies, third trimester: Secondary | ICD-10-CM

## 2018-07-25 DIAGNOSIS — O0991 Supervision of high risk pregnancy, unspecified, first trimester: Secondary | ICD-10-CM

## 2018-07-25 LAB — POCT URINALYSIS DIPSTICK OB
GLUCOSE, UA: NEGATIVE
POC,PROTEIN,UA: NEGATIVE

## 2018-07-25 MED ORDER — HYDROXYPROGESTERONE CAPROATE 250 MG/ML IM OIL
250.0000 mg | TOPICAL_OIL | Freq: Once | INTRAMUSCULAR | Status: AC
  Administered 2018-07-25: 250 mg via INTRAMUSCULAR

## 2018-07-25 NOTE — Progress Notes (Signed)
ROB Growth Scan

## 2018-07-25 NOTE — Progress Notes (Signed)
Routine Prenatal Care Visit  Subjective  Julia Gay is a 37 y.o. [id number] at [redacted]w[redacted]d being seen today for ongoing prenatal care.  She is currently monitored for the following issues for this high-risk pregnancy and has Obsessive compulsive disorder; Preventative health care; AMA (advanced maternal age) multigravida 16+, first trimester; Vitamin D deficiency; Supervision of high risk pregnancy, antepartum, first trimester; History of pre-eclampsia in prior pregnancy, currently pregnant in first trimester; and History of preterm delivery, currently pregnant, unspecified trimester on their problem list.  ----------------------------------------------------------------------------------- Patient reports generally not feeling well- light headed. She says this happens towards the end of her pregnancies. Recommended she stay well hydrated and increase her protein intake and decrease her sugar intake.   Contractions: Not present. Vag. Bleeding: None.  Movement: Present. Denies leaking of fluid.  ----------------------------------------------------------------------------------- The following portions of the patient's history were reviewed and updated as appropriate: allergies, current medications, past family history, past medical history, past social history, past surgical history and problem list. Problem list updated.   Objective  Blood pressure 124/64, weight 221 lb (100.2 kg), last menstrual period 12/18/2017. Pregravid weight 193 lb (87.5 kg) Total Weight Gain 28 lb (12.7 kg) Urinalysis: Urine Protein Negative  Urine Glucose Negative  Fetal Status: Fetal Heart Rate (bpm): 132   Movement: Present     Growth scan: 80%, 4#15oz, AC >97%, AFI 14.4, Presentation: breech  General:  Alert, oriented and cooperative. Patient is in no acute distress.  Skin: Skin is warm and dry. No rash noted.   Cardiovascular: Normal heart rate noted  Respiratory: Normal respiratory effort, no problems with respiration  noted  Abdomen: Soft, gravid, appropriate for gestational age. Pain/Pressure: Absent     Pelvic:  Cervical exam deferred        Extremities: Normal range of motion.  Edema: Trace  Mental Status: Normal mood and affect. Normal behavior. Normal judgment and thought content.   Assessment   37 y.o. [id number] at [redacted]w[redacted]d by  09/24/2018, by Last Menstrual Period presenting for routine prenatal visit  Plan   pregnancy4 Problems (from 12/18/17 to present)    Problem Noted Resolved   Supervision of high risk pregnancy, antepartum, first trimester 02/14/2018 by Gae Dry, MD No   Overview Addendum 06/27/2018  9:27 AM by Homero Fellers, MD    Clinic [facility] Prenatal Labs  Dating  Blood type: A/Positive/-- (06/10 1025)   Genetic Screen  NIPS: normal XX Antibody:Negative (06/10 1025)  Anatomic [place]  Rubella: 3.49 (06/10 1025)   Varicella: Immune  GTT Early:               Third trimester:  RPR: Non Reactive (06/10 1025)   Rhogam  not needed HBsAg: Negative (06/10 1025)   TDaP vaccine                        Flu Shot: 05/06/18 HIV: Non Reactive (06/10 1025)   Baby Food    bottle                            GBS:   Contraception  undecided- given info Pap: v2017 NIL  CBB     CS/VBAC    Support Person                  Preterm labor symptoms and general obstetric precautions including but not limited to vaginal bleeding, contractions, leaking of fluid and fetal movement were reviewed  in detail with the patient.   Return in about 2 weeks (around 08/08/2018) for rob.  Rod Can, CNM 07/25/2018 9:07 AM

## 2018-08-01 ENCOUNTER — Ambulatory Visit (INDEPENDENT_AMBULATORY_CARE_PROVIDER_SITE_OTHER): Payer: Medicaid Other

## 2018-08-01 DIAGNOSIS — Z3A32 32 weeks gestation of pregnancy: Secondary | ICD-10-CM | POA: Diagnosis not present

## 2018-08-01 DIAGNOSIS — O09213 Supervision of pregnancy with history of pre-term labor, third trimester: Secondary | ICD-10-CM

## 2018-08-01 DIAGNOSIS — O09893 Supervision of other high risk pregnancies, third trimester: Secondary | ICD-10-CM

## 2018-08-01 MED ORDER — HYDROXYPROGESTERONE CAPROATE 250 MG/ML IM OIL
250.0000 mg | TOPICAL_OIL | Freq: Once | INTRAMUSCULAR | Status: AC
  Administered 2018-08-01: 250 mg via INTRAMUSCULAR

## 2018-08-08 ENCOUNTER — Other Ambulatory Visit (HOSPITAL_COMMUNITY)
Admission: RE | Admit: 2018-08-08 | Discharge: 2018-08-08 | Disposition: A | Discharge status: Home / Self Care | Payer: Medicaid Other | Source: Ambulatory Visit | Attending: Obstetrics & Gynecology | Admitting: Obstetrics & Gynecology

## 2018-08-08 ENCOUNTER — Ambulatory Visit (INDEPENDENT_AMBULATORY_CARE_PROVIDER_SITE_OTHER): Payer: Medicaid Other | Admitting: Obstetrics & Gynecology

## 2018-08-08 VITALS — BP 118/80 | Wt 222.0 lb

## 2018-08-08 DIAGNOSIS — O09213 Supervision of pregnancy with history of pre-term labor, third trimester: Secondary | ICD-10-CM

## 2018-08-08 DIAGNOSIS — N898 Other specified noninflammatory disorders of vagina: Secondary | ICD-10-CM | POA: Insufficient documentation

## 2018-08-08 DIAGNOSIS — O0993 Supervision of high risk pregnancy, unspecified, third trimester: Secondary | ICD-10-CM

## 2018-08-08 DIAGNOSIS — O09523 Supervision of elderly multigravida, third trimester: Secondary | ICD-10-CM

## 2018-08-08 DIAGNOSIS — Z3A33 33 weeks gestation of pregnancy: Secondary | ICD-10-CM | POA: Diagnosis not present

## 2018-08-08 DIAGNOSIS — O09291 Supervision of pregnancy with other poor reproductive or obstetric history, first trimester: Secondary | ICD-10-CM

## 2018-08-08 DIAGNOSIS — O09293 Supervision of pregnancy with other poor reproductive or obstetric history, third trimester: Secondary | ICD-10-CM

## 2018-08-08 DIAGNOSIS — O09893 Supervision of other high risk pregnancies, third trimester: Secondary | ICD-10-CM

## 2018-08-08 DIAGNOSIS — O09521 Supervision of elderly multigravida, first trimester: Secondary | ICD-10-CM

## 2018-08-08 MED ORDER — HYDROXYPROGESTERONE CAPROATE 250 MG/ML IM OIL
250.0000 mg | TOPICAL_OIL | Freq: Once | INTRAMUSCULAR | Status: AC
  Administered 2018-08-08: 250 mg via INTRAMUSCULAR

## 2018-08-08 NOTE — Progress Notes (Signed)
  Subjective  Fetal Movement? yes Contractions? no Leaking Fluid? no Vaginal Bleeding? no Vag discharge and odor noted for last few days; denies itch or burn  Objective  BP 118/80   Wt 222 lb (100.7 kg)   LMP 12/18/2017   BMI 38.11 kg/m  General: NAD Pumonary: no increased work of breathing Abdomen: gravid, non-tender Extremities: no edema Psychiatric: mood appropriate, affect full  Assessment  37 y.o. [id number] at [redacted]w[redacted]d by  09/24/2018, by Last Menstrual Period presenting for routine prenatal visit  Plan   Problem List Items Addressed This Visit      Other   AMA (advanced maternal age) multigravida 35+, first trimester   Supervision of high risk pregnancy, antepartum, first trimester   History of pre-eclampsia in prior pregnancy, currently pregnant in first trimester (with first pregnancy)   [redacted] weeks gestation of pregnancy    -  Primary   History of preterm delivery, currently pregnant in third trimester  (with second pregnancy)    PPROM 35 weeks     Vaginal discharge       Relevant Orders   Cervicovaginal ancillary only Assess for BV as can also be etiology for PPROM or PTL    [place] 36 weeks for growth and position PNV, [place]  Barnett Applebaum, MD, [facility], [facility] 08/08/2018  4:15 PM

## 2018-08-08 NOTE — Addendum Note (Signed)
Addended by: Quintella Baton D on: 08/08/2018 04:31 PM   Modules accepted: Orders

## 2018-08-08 NOTE — Patient Instructions (Signed)
Back Pain in Pregnancy Back pain during pregnancy is common. Back pain may be caused by several factors that are related to changes during your pregnancy. Follow these instructions at home: Managing pain, stiffness, and swelling  If directed, apply ice for sudden (acute) back pain. ? Put ice in a plastic bag. ? Place a towel between your skin and the bag. ? Leave the ice on for 20 minutes, 2-3 times per day.  If directed, apply heat to the affected area before you exercise: ? Place a towel between your skin and the heat pack or heating pad. ? Leave the heat on for 20-30 minutes. ? Remove the heat if your skin turns bright red. This is especially important if you are unable to feel pain, heat, or cold. You may have a greater risk of getting burned. Activity  Exercise as told by your health care provider. Exercising is the best way to prevent or manage back pain.  Listen to your body when lifting. If lifting hurts, ask for help or bend your knees. This uses your leg muscles instead of your back muscles.  Squat down when picking up something from the floor. Do not bend over.  Only use bed rest as told by your health care provider. Bed rest should only be used for the most severe episodes of back pain. Standing, Sitting, and Lying Down  Do not stand in one place for long periods of time.  Use good posture when sitting. Make sure your head rests over your shoulders and is not hanging forward. Use a pillow on your lower back if necessary.  Try sleeping on your side, preferably the left side, with a pillow or two between your legs. If you are sore after a night's rest, your bed may be too soft. A firm mattress may provide more support for your back during pregnancy. General instructions  Do not wear high heels.  Eat a healthy diet. Try to gain weight within your health care provider's recommendations.  Use a maternity girdle, elastic sling, or back brace as told by your health care  provider.  Take over-the-counter and prescription medicines only as told by your health care provider.  Keep all follow-up visits as told by your health care provider. This is important. This includes any visits with any specialists, such as a physical therapist. Contact a health care provider if:  Your back pain interferes with your daily activities.  You have increasing pain in other parts of your body. Get help right away if:  You develop numbness, tingling, weakness, or problems with the use of your arms or legs.  You develop severe back pain that is not controlled with medicine.  You have a sudden change in bowel or bladder control.  You develop shortness of breath, dizziness, or you faint.  You develop nausea, vomiting, or sweating.  You have back pain that is a rhythmic, cramping pain similar to labor pains. Labor pain is usually 1-2 minutes apart, lasts for about 1 minute, and involves a bearing down feeling or pressure in your pelvis.  You have back pain and your water breaks or you have vaginal bleeding.  You have back pain or numbness that travels down your leg.  Your back pain developed after you fell.  You develop pain on one side of your back.  You see blood in your urine.  You develop skin blisters in the area of your back pain. This information is not intended to replace advice given to you   by your health care provider. Make sure you discuss any questions you have with your health care provider. Document Released: 12/02/2005 Document Revised: 01/30/2016 Document Reviewed: 05/08/2015 Elsevier Interactive Patient Education  [employer].

## 2018-08-11 LAB — CERVICOVAGINAL ANCILLARY ONLY
Bacterial vaginitis: NEGATIVE
Candida vaginitis: NEGATIVE

## 2018-08-11 NOTE — Progress Notes (Signed)
Pt aware.

## 2018-08-11 NOTE — Progress Notes (Signed)
Let her know labs neg

## 2018-08-15 ENCOUNTER — Ambulatory Visit (INDEPENDENT_AMBULATORY_CARE_PROVIDER_SITE_OTHER): Payer: Medicaid Other

## 2018-08-15 DIAGNOSIS — O09893 Supervision of other high risk pregnancies, third trimester: Secondary | ICD-10-CM

## 2018-08-15 DIAGNOSIS — O09213 Supervision of pregnancy with history of pre-term labor, third trimester: Secondary | ICD-10-CM | POA: Diagnosis not present

## 2018-08-15 MED ORDER — HYDROXYPROGESTERONE CAPROATE 250 MG/ML IM OIL
250.0000 mg | TOPICAL_OIL | Freq: Once | INTRAMUSCULAR | Status: AC
  Administered 2018-08-15: 250 mg via INTRAMUSCULAR

## 2018-08-22 ENCOUNTER — Ambulatory Visit (INDEPENDENT_AMBULATORY_CARE_PROVIDER_SITE_OTHER): Payer: Medicaid Other | Admitting: Obstetrics & Gynecology

## 2018-08-22 VITALS — BP 120/80 | Wt 228.0 lb

## 2018-08-22 DIAGNOSIS — O0993 Supervision of high risk pregnancy, unspecified, third trimester: Secondary | ICD-10-CM

## 2018-08-22 DIAGNOSIS — Z3A35 35 weeks gestation of pregnancy: Secondary | ICD-10-CM

## 2018-08-22 DIAGNOSIS — O0991 Supervision of high risk pregnancy, unspecified, first trimester: Secondary | ICD-10-CM

## 2018-08-22 DIAGNOSIS — O09213 Supervision of pregnancy with history of pre-term labor, third trimester: Secondary | ICD-10-CM

## 2018-08-22 DIAGNOSIS — O09523 Supervision of elderly multigravida, third trimester: Secondary | ICD-10-CM

## 2018-08-22 DIAGNOSIS — Z8751 Personal history of pre-term labor: Secondary | ICD-10-CM

## 2018-08-22 DIAGNOSIS — O09521 Supervision of elderly multigravida, first trimester: Secondary | ICD-10-CM

## 2018-08-22 MED ORDER — HYDROXYPROGESTERONE CAPROATE 250 MG/ML IM OIL
250.0000 mg | TOPICAL_OIL | Freq: Once | INTRAMUSCULAR | Status: AC
  Administered 2018-08-22: 250 mg via INTRAMUSCULAR

## 2018-08-22 NOTE — Progress Notes (Signed)
  Subjective  Fetal Movement? yes Contractions? no Leaking Fluid? no Vaginal Bleeding? no  Objective  BP 120/80   Wt 228 lb (103.4 kg)   LMP 12/18/2017   BMI 39.14 kg/m  General: NAD Pumonary: no increased work of breathing Abdomen: gravid, non-tender Extremities: no edema Psychiatric: mood appropriate, affect full  Assessment  37 y.o. [id number] at [redacted]w[redacted]d by  09/24/2018, by Last Menstrual Period presenting for routine prenatal visit  Plan   Problem List Items Addressed This Visit      Other   AMA (advanced maternal age) multigravida 35+, first trimester   Relevant Orders   US OB Follow Up   Supervision of high risk pregnancy, antepartum, first trimester    Other Visit Diagnoses    Supervision of high risk pregnancy in third trimester    -  Primary   Relevant Orders   US OB Follow Up    Makena today, last one [place] nv    If breech, consider ECV.  Discussed today    Also assess growth Delivery by 40 weeks  Barnett Applebaum, MD, [facility], [facility] 08/22/2018  4:56 PM

## 2018-08-22 NOTE — Addendum Note (Signed)
Addended by: Quintella Baton D on: 08/22/2018 05:05 PM   Modules accepted: Orders

## 2018-08-22 NOTE — Patient Instructions (Signed)
Braxton Hicks Contractions °Contractions of the uterus can occur throughout pregnancy, but they are not always a sign that you are in labor. You may have practice contractions called Braxton Hicks contractions. These false labor contractions are sometimes confused with true labor. °What are Braxton Hicks contractions? °Braxton Hicks contractions are tightening movements that occur in the muscles of the uterus before labor. Unlike true labor contractions, these contractions do not result in opening (dilation) and thinning of the cervix. Toward the end of pregnancy (32-34 weeks), Braxton Hicks contractions can happen more often and may become stronger. These contractions are sometimes difficult to tell apart from true labor because they can be very uncomfortable. You should not feel embarrassed if you go to the hospital with false labor. °Sometimes, the only way to tell if you are in true labor is for your health care provider to look for changes in the cervix. The health care provider will do a physical exam and may monitor your contractions. If you are not in true labor, the exam should show that your cervix is not dilating and your water has not broken. °If there are other health problems associated with your pregnancy, it is completely safe for you to be sent home with false labor. You may continue to have Braxton Hicks contractions until you go into true labor. °How to tell the difference between true labor and false labor °True labor °· Contractions last 30-70 seconds. °· Contractions become very regular. °· Discomfort is usually felt in the top of the uterus, and it spreads to the lower abdomen and low back. °· Contractions do not go away with walking. °· Contractions usually become more intense and increase in frequency. °· The cervix dilates and gets thinner. °False labor °· Contractions are usually shorter and not as strong as true labor contractions. °· Contractions are usually irregular. °· Contractions  are often felt in the front of the lower abdomen and in the groin. °· Contractions may go away when you walk around or change positions while lying down. °· Contractions get weaker and are shorter-lasting as time goes on. °· The cervix usually does not dilate or become thin. °Follow these instructions at home: °· Take over-the-counter and prescription medicines only as told by your health care provider. °· Keep up with your usual exercises and follow other instructions from your health care provider. °· Eat and drink lightly if you think you are going into labor. °· If Braxton Hicks contractions are making you uncomfortable: °? Change your position from lying down or resting to walking, or change from walking to resting. °? Sit and rest in a tub of warm water. °? Drink enough fluid to keep your urine pale yellow. Dehydration may cause these contractions. °? Do slow and deep breathing several times an hour. °· Keep all follow-up prenatal visits as told by your health care provider. This is important. °Contact a health care provider if: °· You have a fever. °· You have continuous pain in your abdomen. °Get help right away if: °· Your contractions become stronger, more regular, and closer together. °· You have fluid leaking or gushing from your vagina. °· You pass blood-tinged mucus (bloody show). °· You have bleeding from your vagina. °· You have low back pain that you never had before. °· You feel your baby’s head pushing down and causing pelvic pressure. °· Your baby is not moving inside you as much as it used to. °Summary °· Contractions that occur before labor are called Braxton   Hicks contractions, false labor, or practice contractions. °· Braxton Hicks contractions are usually shorter, weaker, farther apart, and less regular than true labor contractions. True labor contractions usually become progressively stronger and regular and they become more frequent. °· Manage discomfort from Braxton Hicks contractions by  changing position, resting in a warm bath, drinking plenty of water, or practicing deep breathing. °This information is not intended to replace advice given to you by your health care provider. Make sure you discuss any questions you have with your health care provider. °Document Released: 01/07/2017 Document Revised: 01/07/2017 Document Reviewed: 01/07/2017 °Elsevier Interactive Patient Education © 2018 [employer] ° °

## 2018-08-29 ENCOUNTER — Ambulatory Visit (INDEPENDENT_AMBULATORY_CARE_PROVIDER_SITE_OTHER): Payer: Medicaid Other | Admitting: Obstetrics & Gynecology

## 2018-08-29 ENCOUNTER — Ambulatory Visit (INDEPENDENT_AMBULATORY_CARE_PROVIDER_SITE_OTHER): Payer: Medicaid Other

## 2018-08-29 VITALS — BP 122/80 | Wt 227.0 lb

## 2018-08-29 DIAGNOSIS — O09293 Supervision of pregnancy with other poor reproductive or obstetric history, third trimester: Secondary | ICD-10-CM

## 2018-08-29 DIAGNOSIS — O0993 Supervision of high risk pregnancy, unspecified, third trimester: Secondary | ICD-10-CM

## 2018-08-29 DIAGNOSIS — Z362 Encounter for other antenatal screening follow-up: Secondary | ICD-10-CM | POA: Diagnosis not present

## 2018-08-29 DIAGNOSIS — Z8751 Personal history of pre-term labor: Secondary | ICD-10-CM

## 2018-08-29 DIAGNOSIS — O09521 Supervision of elderly multigravida, first trimester: Secondary | ICD-10-CM

## 2018-08-29 DIAGNOSIS — O09213 Supervision of pregnancy with history of pre-term labor, third trimester: Secondary | ICD-10-CM

## 2018-08-29 DIAGNOSIS — O09291 Supervision of pregnancy with other poor reproductive or obstetric history, first trimester: Secondary | ICD-10-CM

## 2018-08-29 DIAGNOSIS — O09523 Supervision of elderly multigravida, third trimester: Secondary | ICD-10-CM

## 2018-08-29 DIAGNOSIS — Z3A36 36 weeks gestation of pregnancy: Secondary | ICD-10-CM

## 2018-08-29 LAB — POCT URINALYSIS DIPSTICK OB
Glucose, UA: NEGATIVE
PROTEIN: NEGATIVE

## 2018-08-29 NOTE — Progress Notes (Signed)
  Subjective  Fetal Movement? yes Contractions? no Leaking Fluid? no Vaginal Bleeding? no  Objective  BP 122/80   Wt 227 lb (103 kg)   LMP 12/18/2017   BMI 38.96 kg/m  General: NAD Pumonary: no increased work of breathing Abdomen: gravid, non-tender Extremities: no edema Psychiatric: mood appropriate, affect full SVE: 1 20% high station out of pelvis Assessment  37 y.o. [id number] at [redacted]w[redacted]d by  09/24/2018, by Last Menstrual Period presenting for routine prenatal visit  Plan   Problem List Items Addressed This Visit      Other   AMA (advanced maternal age) multigravida 40+, first trimester   History of pre-eclampsia in prior pregnancy, currently pregnant in first trimester    Other Visit Diagnoses    [redacted] weeks gestation of pregnancy    -  Primary   Supervision of high risk pregnancy in third trimester       History of preterm labor        Review of ULTRASOUND.    I have personally reviewed images and report of recent ultrasound done at [facility].    Plan of management to be discussed with patient. LGA discussed.  Vtx. GBS today  Barnett Applebaum, MD, [facility], [facility] 08/29/2018  1:23 PM

## 2018-08-29 NOTE — Addendum Note (Signed)
Addended by: Quintella Baton D on: 08/29/2018 01:28 PM   Modules accepted: Orders

## 2018-09-01 ENCOUNTER — Telehealth: Payer: Self-pay

## 2018-09-01 LAB — CULTURE, BETA STREP (GROUP B ONLY): Strep Gp B Culture: POSITIVE — AB

## 2018-09-01 NOTE — Telephone Encounter (Signed)
Called pt no answer. There is nothing pt needs to do ,wait for signs of labor, contractions or her water breaks.

## 2018-09-01 NOTE — Telephone Encounter (Signed)
Patient thinks she lost her mucus plug yesterday.  No other symptoms.  What should she do?

## 2018-09-02 ENCOUNTER — Observation Stay
Admission: EM | Admit: 2018-09-02 | Discharge: 2018-09-04 | Disposition: A | Discharge status: Home / Self Care | Payer: Medicaid Other | Attending: Obstetrics and Gynecology | Admitting: Obstetrics and Gynecology

## 2018-09-02 ENCOUNTER — Other Ambulatory Visit: Payer: Self-pay

## 2018-09-02 DIAGNOSIS — O212 Late vomiting of pregnancy: Secondary | ICD-10-CM | POA: Diagnosis present

## 2018-09-02 DIAGNOSIS — R000 Tachycardia, unspecified: Secondary | ICD-10-CM | POA: Diagnosis not present

## 2018-09-02 DIAGNOSIS — R9431 Abnormal electrocardiogram [ECG] [EKG]: Secondary | ICD-10-CM | POA: Diagnosis not present

## 2018-09-02 DIAGNOSIS — Z3A37 37 weeks gestation of pregnancy: Secondary | ICD-10-CM | POA: Diagnosis not present

## 2018-09-02 DIAGNOSIS — K30 Functional dyspepsia: Secondary | ICD-10-CM

## 2018-09-02 DIAGNOSIS — O99613 Diseases of the digestive system complicating pregnancy, third trimester: Secondary | ICD-10-CM | POA: Diagnosis not present

## 2018-09-02 DIAGNOSIS — O0991 Supervision of high risk pregnancy, unspecified, first trimester: Secondary | ICD-10-CM

## 2018-09-02 DIAGNOSIS — R1084 Generalized abdominal pain: Secondary | ICD-10-CM

## 2018-09-02 DIAGNOSIS — F429 Obsessive-compulsive disorder, unspecified: Secondary | ICD-10-CM | POA: Diagnosis not present

## 2018-09-02 DIAGNOSIS — Z87891 Personal history of nicotine dependence: Secondary | ICD-10-CM | POA: Insufficient documentation

## 2018-09-02 DIAGNOSIS — O99343 Other mental disorders complicating pregnancy, third trimester: Secondary | ICD-10-CM | POA: Insufficient documentation

## 2018-09-02 DIAGNOSIS — I498 Other specified cardiac arrhythmias: Secondary | ICD-10-CM | POA: Insufficient documentation

## 2018-09-02 DIAGNOSIS — O99413 Diseases of the circulatory system complicating pregnancy, third trimester: Secondary | ICD-10-CM | POA: Insufficient documentation

## 2018-09-02 DIAGNOSIS — O219 Vomiting of pregnancy, unspecified: Secondary | ICD-10-CM | POA: Diagnosis present

## 2018-09-02 MED ORDER — ONDANSETRON 4 MG PO TBDP
4.0000 mg | ORAL_TABLET | ORAL | Status: DC | PRN
  Administered 2018-09-02 – 2018-09-03 (×2): 4 mg via ORAL
  Filled 2018-09-02 (×2): qty 1

## 2018-09-02 MED ORDER — FAMOTIDINE 20 MG PO TABS
20.0000 mg | ORAL_TABLET | Freq: Two times a day (BID) | ORAL | Status: DC
  Administered 2018-09-02 – 2018-09-04 (×3): 20 mg via ORAL
  Filled 2018-09-02 (×3): qty 1

## 2018-09-02 NOTE — OB Triage Note (Signed)
Pt presents from ED with complaints of vomiting since 1930 and decreased fetal movement since. Denies bleeding, leaking of fluid or intercourse in last 24 hours.

## 2018-09-03 ENCOUNTER — Observation Stay: Payer: Medicaid Other

## 2018-09-03 ENCOUNTER — Encounter: Payer: Self-pay | Admitting: Certified Nurse Midwife

## 2018-09-03 DIAGNOSIS — Z3A37 37 weeks gestation of pregnancy: Secondary | ICD-10-CM

## 2018-09-03 DIAGNOSIS — O26893 Other specified pregnancy related conditions, third trimester: Secondary | ICD-10-CM

## 2018-09-03 DIAGNOSIS — R109 Unspecified abdominal pain: Secondary | ICD-10-CM

## 2018-09-03 DIAGNOSIS — O212 Late vomiting of pregnancy: Secondary | ICD-10-CM | POA: Diagnosis not present

## 2018-09-03 DIAGNOSIS — O09293 Supervision of pregnancy with other poor reproductive or obstetric history, third trimester: Secondary | ICD-10-CM

## 2018-09-03 DIAGNOSIS — O09213 Supervision of pregnancy with history of pre-term labor, third trimester: Secondary | ICD-10-CM | POA: Diagnosis not present

## 2018-09-03 LAB — COMPREHENSIVE METABOLIC PANEL
ALT: 9 U/L (ref 0–44)
AST: 18 U/L (ref 15–41)
Albumin: 3.2 g/dL — ABNORMAL LOW (ref 3.5–5.0)
Alkaline Phosphatase: 79 U/L (ref 38–126)
Anion gap: 11 (ref 5–15)
BUN: 7 mg/dL (ref 6–20)
CO2: 19 mmol/L — ABNORMAL LOW (ref 22–32)
Calcium: 9.1 mg/dL (ref 8.9–10.3)
Chloride: 106 mmol/L (ref 98–111)
Creatinine, Ser: 0.48 mg/dL (ref 0.44–1.00)
GFR calc Af Amer: >60 mL/min (ref >60)
GFR calc non Af Amer: >60 mL/min (ref >60)
Glucose, Bld: 79 mg/dL (ref 70–99)
Potassium: 3.6 mmol/L (ref 3.5–5.1)
Sodium: 136 mmol/L (ref 135–145)
Total Bilirubin: 0.8 mg/dL (ref 0.3–1.2)
Total Protein: 6.6 g/dL (ref 6.5–8.1)

## 2018-09-03 LAB — URINALYSIS, COMPLETE (UACMP) WITH MICROSCOPIC
Bilirubin Urine: NEGATIVE
Glucose, UA: NEGATIVE mg/dL
Hgb urine dipstick: NEGATIVE
Ketones, ur: 80 mg/dL — AB
LEUKOCYTES UA: NEGATIVE
Nitrite: NEGATIVE
Protein, ur: NEGATIVE mg/dL
Specific Gravity, Urine: 1.019 (ref 1.005–1.030)
pH: 5 (ref 5.0–8.0)

## 2018-09-03 LAB — CBC
HCT: 36.6 % (ref 36.0–46.0)
Hemoglobin: 11.9 g/dL — ABNORMAL LOW (ref 12.0–15.0)
MCH: 28.3 pg (ref 26.0–34.0)
MCHC: 32.5 g/dL (ref 30.0–36.0)
MCV: 86.9 fL (ref 80.0–100.0)
NRBC: 0 % (ref 0.0–0.2)
PLATELETS: 169 10*3/uL (ref 150–400)
RBC: 4.21 MIL/uL (ref 3.87–5.11)
RDW: 13.3 % (ref 11.5–15.5)
WBC: 12.4 10*3/uL — ABNORMAL HIGH (ref 4.0–10.5)

## 2018-09-03 LAB — LIPASE, BLOOD: LIPASE: 32 U/L (ref 11–51)

## 2018-09-03 LAB — INFLUENZA PANEL BY PCR (TYPE A & B)
Influenza A By PCR: NEGATIVE
Influenza B By PCR: NEGATIVE

## 2018-09-03 MED ORDER — IOPAMIDOL (ISOVUE-370) INJECTION 76%
75.0000 mL | Freq: Once | INTRAVENOUS | Status: AC | PRN
  Administered 2018-09-03: 75 mL via INTRAVENOUS

## 2018-09-03 MED ORDER — LACTATED RINGERS IV SOLN
INTRAVENOUS | Status: DC
  Administered 2018-09-03: 22:00:00 via INTRAVENOUS
  Administered 2018-09-03: 1000 mL via INTRAVENOUS
  Administered 2018-09-03 (×2): via INTRAVENOUS

## 2018-09-03 MED ORDER — LACTATED RINGERS IV BOLUS
1000.0000 mL | Freq: Once | INTRAVENOUS | Status: AC
  Administered 2018-09-03: 1000 mL via INTRAVENOUS

## 2018-09-03 MED ORDER — ALUM & MAG HYDROXIDE-SIMETH 200-200-20 MG/5ML PO SUSP: 30.0000 mL | Freq: Once | ORAL | Status: DC

## 2018-09-03 MED ORDER — PROMETHAZINE HCL 25 MG/ML IJ SOLN
12.5000 mg | Freq: Four times a day (QID) | INTRAMUSCULAR | Status: DC | PRN
  Administered 2018-09-03: 12.5 mg via INTRAVENOUS
  Filled 2018-09-03: qty 1

## 2018-09-03 MED ORDER — SODIUM CHLORIDE FLUSH 0.9 % IV SOLN
INTRAVENOUS | Status: AC
  Filled 2018-09-03: qty 10

## 2018-09-03 MED ORDER — LIDOCAINE VISCOUS HCL 2 % MT SOLN
15.0000 mL | Freq: Once | OROMUCOSAL | Status: DC
  Filled 2018-09-03: qty 15

## 2018-09-03 MED ORDER — ONDANSETRON HCL 4 MG/2ML IJ SOLN
INTRAMUSCULAR | Status: AC
  Administered 2018-09-03: 4 mg
  Filled 2018-09-03: qty 2

## 2018-09-03 MED ORDER — ACETAMINOPHEN 500 MG PO TABS
ORAL_TABLET | ORAL | Status: AC
  Administered 2018-09-03: 500 mg
  Filled 2018-09-03: qty 2

## 2018-09-03 NOTE — Consult Note (Signed)
[place] at [facility] Consultation  Julia Gay OAC:[id number] DOB: 1981/01/31 DOA: 09/02/2018 PCP: Pleas Koch, NP   Requesting physician: Will Bonnet, MD Date of consultation: 09/03/2018 Reason for consultation: Sinus tachycardia abnormal EKG CHIEF COMPLAINT:  No chief complaint on file.   HISTORY OF PRESENT ILLNESS: Julia Gay  is a 37 y.o. female who is [redacted] weeks pregnant who was admitted by [facility] service yesterday.  Patient states that she started feeling tired since Tuesday.  Then she started getting progressively nauseous and having vomiting since yesterday.  She has been unable to keep any food down.  EKG showed sinus tachycardia. Denies any fevers, chills, no chest pain no shortness of breath no peripheral edema   PAST MEDICAL HISTORY:   Past Medical History:  Diagnosis Date  . Hay fever   . History of HPV infection   . History of UTI   . Retained placenta     PAST SURGICAL HISTORY:  Past Surgical History:  Procedure Laterality Date  . DILATION AND CURETTAGE OF UTERUS N/A 12/17/2015   Procedure: DILATATION AND CURETTAGE;  Surgeon: Brayton Mars, MD;  Location: [facility] ORS;  Service: Gynecology;  Laterality: N/A;  . LEEP  2006    SOCIAL HISTORY:  Social History   Tobacco Use  . Smoking status: Former Research scientist (life sciences)  . Smokeless tobacco: Never Used  . Tobacco comment: quit in 2004  Substance Use Topics  . Alcohol use: Yes    Alcohol/week: 0.0 standard drinks    Comment: socially    FAMILY HISTORY:  Family History  Problem Relation Age of Onset  . Alcohol abuse Father        quit when pt was a child  . Heart disease Father   . Stroke Father   . Hypertension Father   . Aplastic anemia Father   . Alcohol abuse Other        both grandparents  . Anemia Mother     DRUG ALLERGIES: No Known Allergies  REVIEW OF SYSTEMS:   CONSTITUTIONAL: No fever, fatigue or weakness.  EYES: No blurred or double vision.   EARS, NOSE, AND THROAT: No tinnitus or ear pain.  RESPIRATORY: No cough, shortness of breath, wheezing or hemoptysis.  CARDIOVASCULAR: No chest pain, orthopnea, edema.  GASTROINTESTINAL: Positive nausea, positive vomiting, no diarrhea or abdominal pain.  GENITOURINARY: No dysuria, hematuria.  ENDOCRINE: No polyuria, nocturia,  HEMATOLOGY: No anemia, easy bruising or bleeding SKIN: No rash or lesion. MUSCULOSKELETAL: No joint pain or arthritis.   NEUROLOGIC: No tingling, numbness, weakness.  PSYCHIATRY: No anxiety or depression.   MEDICATIONS AT HOME:  Prior to Admission medications   Medication Sig Start Date End Date Taking? Authorizing Provider  ferrous sulfate 325 (65 FE) MG tablet Take 325 mg by mouth daily with breakfast.   Yes [provider]  Prenatal Vit-Fe Fumarate-FA (PREPLUS) 27-1 MG TABS Take 1 tablet by mouth daily. 01/25/18  Yes [provider]  sertraline (ZOLOFT) 50 MG tablet Take 1 tablet by mouth daily. Reported on 01/13/2016   Yes [provider]  metroNIDAZOLE (METROCREAM) 0.75 % cream  12/17/17   [provider]      PHYSICAL EXAMINATION:   VITAL SIGNS: Blood pressure 120/60, pulse (!) 121, temperature 98.7 F (37.1 C), temperature source Oral, resp. rate 20, last menstrual period 12/18/2017, SpO2 98 %.  GENERAL:  37 y.o.-year-old patient lying in the bed with no acute distress.  EYES: Pupils equal, round, reactive to  light and accommodation. No scleral icterus. Extraocular muscles intact.  HEENT: Head atraumatic, normocephalic. Oropharynx and nasopharynx clear.  NECK:  Supple, no jugular venous distention. No thyroid enlargement, no tenderness.  LUNGS: Normal breath sounds bilaterally, no wheezing, rales,rhonchi or crepitation. No use of accessory muscles of respiration.  CARDIOVASCULAR: S1, S2 tachycardic no murmurs, rubs, or gallops.  ABDOMEN: Soft, nontender, nondistended. Bowel sounds present. No organomegaly or mass.   EXTREMITIES: No pedal edema, cyanosis, or clubbing.  NEUROLOGIC: Cranial nerves II through XII are intact. Muscle strength 5/5 in all extremities. Sensation intact. Gait not checked.  PSYCHIATRIC: The patient is alert and oriented x 3.  SKIN: No obvious rash, lesion, or ulcer.   LABORATORY PANEL:   CBC Recent Labs  Lab 09/03/18 0110  WBC 12.4*  HGB 11.9*  HCT 36.6  PLT 169  MCV 86.9  MCH 28.3  MCHC 32.5  RDW 13.3   ------------------------------------------------------------------------------------------------------------------  Chemistries  Recent Labs  Lab 09/03/18 0110  NA 136  K 3.6  CL 106  CO2 19*  GLUCOSE 79  BUN 7  CREATININE 0.48  CALCIUM 9.1  AST 18  ALT 9  ALKPHOS 79  BILITOT 0.8   ------------------------------------------------------------------------------------------------------------------ CrCl cannot be calculated (Unknown ideal weight.). ------------------------------------------------------------------------------------------------------------------ No results for input(s): TSH, T4TOTAL, T3FREE, THYROIDAB in the last 72 hours.  Invalid input(s): FREET3   Coagulation profile No results for input(s): INR, PROTIME in the last 168 hours. ------------------------------------------------------------------------------------------------------------------- No results for input(s): DDIMER in the last 72 hours. -------------------------------------------------------------------------------------------------------------------  Cardiac Enzymes No results for input(s): CKMB, TROPONINI, MYOGLOBIN in the last 168 hours.  Invalid input(s): CK ------------------------------------------------------------------------------------------------------------------ Invalid input(s): POCBNP  ---------------------------------------------------------------------------------------------------------------  Urinalysis    Component Value Date/Time   COLORURINE  YELLOW (A) 09/02/2018 2333   APPEARANCEUR HAZY (A) 09/02/2018 2333   APPEARANCEUR Cloudy (A) 05/30/2015 0910   LABSPEC 1.019 09/02/2018 2333   LABSPEC 1.011 06/01/2012 0320   PHURINE 5.0 09/02/2018 2333   GLUCOSEU NEGATIVE 09/02/2018 2333   GLUCOSEU Negative 06/01/2012 0320   HGBUR NEGATIVE 09/02/2018 2333   BILIRUBINUR NEGATIVE 09/02/2018 2333   BILIRUBINUR Negative 06/27/2018 0901   BILIRUBINUR Negative 05/30/2015 0910   BILIRUBINUR Negative 06/01/2012 0320   KETONESUR 80 (A) 09/02/2018 2333   PROTEINUR NEGATIVE 09/02/2018 2333   UROBILINOGEN 0.2 06/27/2018 0901   NITRITE NEGATIVE 09/02/2018 2333   LEUKOCYTESUR NEGATIVE 09/02/2018 2333   LEUKOCYTESUR Negative 05/30/2015 0910   LEUKOCYTESUR 2+ 06/01/2012 0320     RADIOLOGY: US Abdomen Complete  Result Date: 09/03/2018 CLINICAL DATA:  Acute onset of nausea and vomiting. Generalized abdominal pain. EXAM: ABDOMEN ULTRASOUND COMPLETE COMPARISON:  None. FINDINGS: Gallbladder: No gallstones or wall thickening visualized. No sonographic Murphy sign noted by sonographer. Common bile duct: Diameter: 0.5 cm, within normal limits in caliber. Liver: No focal lesion identified. Within normal limits in parenchymal echogenicity. Portal vein is patent on color Doppler imaging with normal direction of blood flow towards the liver. IVC: No abnormality visualized. Pancreas: Not visualized. Spleen: Somewhat enlarged in appearance.  Volume of 618.4 mL. Right Kidney: Length: 13.1 cm. Echogenicity within normal limits. No mass or hydronephrosis visualized. Left Kidney: Length: 11.8 cm. Echogenicity within normal limits. No mass or hydronephrosis visualized. Abdominal aorta: No aneurysm visualized. Other findings: None. IMPRESSION: 1. No acute abnormality seen within the abdomen. 2. Mild splenomegaly noted. Electronically Signed   By: Garald Balding M.D.   On: 09/03/2018 04:14    EKG: Orders placed or performed during the hospital encounter of 09/02/18  .  EKG 12-Lead  .  EKG 12-Lead    IMPRESSION AND PLAN: Patient is a 37 year old who is 37-week pregnant has been nauseous and throwing up  1.  Sinus tachycardia suspect this is due to dehydration continue IV fluids Consider checking CT scan of the chest to rule out pulmonary embolism I will obtain echocardiogram of the heart If heart rate continues to be elevated may need to be started on a low-dose metoprolol I will also check a TSH with the panel Case discussed with attending physician Dr. Donneta Romberg, MD      All the records are reviewed and case discussed with ED provider. Management plans discussed with the patient, family and they are in agreement.  CODE STATUS:    Code Status Orders  (From admission, onward)         Start     Ordered   09/02/18 2332  Full code  Continuous     09/02/18 2334        Code Status History    Date Active Date Inactive Code Status Order ID Comments User Context   12/02/2015 2029 12/04/2015 1701 Full Code [id number]  Joylene Igo, CNM Inpatient   12/02/2015 0615 12/02/2015 2029 Full Code [id number]  Joylene Igo, CNM Inpatient       TOTAL TIME TAKING CARE OF THIS PATIENT: 55 minutes.    Dustin Flock M.D on 09/03/2018 at 2:51 PM  Between 7am to 6pm - Pager - [phone]  After 6pm go to [email or web address] - password [facility] MD Office  [phone]  CC: Primary care physician; Pleas Koch, NP

## 2018-09-03 NOTE — Progress Notes (Signed)
Continued tachycardia to 120s.  No SOB, no chest pain, no peripheral edema.   Heart sounds with early systolic ejection murmur with exhale.  Lungs clear.   S/p 2.5+ liters IV fluid.   ECG with non-specific read, but appears to be sinus tachycardia.   Will obtain internal medicine consultation prior to further workup as tachycardia could be due to any number of etiologies, including; hypovolemia secondary to gastroenteritis, DVT/PE, cardiomyopathy and more.   Prentice Docker, MD, [facility], [facility] 09/03/2018 3:07 PM

## 2018-09-03 NOTE — Progress Notes (Signed)
Late note  S: Feeling better at this time. No abdominal cramping. Had another episode of nausea and vomiting this AM. Received Zofran IV with relief. No appetite. Not hungry. Husband called this AM and said that their oldest son has started vomiting. No chest pain.NO SOB  O: 112/57. Pulse consistently 120s to 130s thru the night, currently 116 after 2 liters of fluid. General: gravid WF, appears tired, but awake alert and answering questions appropriately FHR: 145-150 with accelerations.  Toco: acontractile at this time  EKG: sinus tachycardia with left posterior fascicular block on machine reading and non specific T wave changes  Abdominal ultrasound: no gallstone, no kidney stone, mild spleenomegaly  A: Probable viral gastroenteritis Sinus tachycardia and non specific changes with possible left fascicuclar block  P: Continue IV hydration Clear liquids as tolerated Consider hospitalist consult or cardiology consult Discussed case with Dr Glennon Mac who was in to evaluate patient  Dalia Heading, CNM

## 2018-09-03 NOTE — H&P (Signed)
OB History & Physical   History of Present Illness:  Chief Complaint:  Sudden onset nausea, vomiting, heartburn, abdominal cramping HPI:  Julia Gay is a 37 y.o. [phone] female with EDC=09/24/2017 at [redacted]w[redacted]d dated by LMP.  Her pregnancy has been complicated by AMA, OCD, history of preeclampsia with G1 and a history of PPROM/preterm labor and delivery at 35 weeks with her last pregnancy. A recent ultrasound for growth estimates fetal weight at 8#3oz (>90th%)  She presents to L&D for evaluation of sudden onset of indigestion, nausea and vomiting, and abdominal cramping.   This occurred after eating out at [employer] Vomited about 5 times on the way to hospital. One BM since this started-which was formed and preceded by cramping. Cramping began in lower abdominal and now generalized.No one else in family with same symptoms. Husband ill with chills and fever 2 weeks ago. Reports having some dysuria today. No fever/ chills. ?decreased fetal movement today.   Prenatal care site: Prenatal care at [facility] has also been remarkable for  Dating LMP Blood type: A/Positive/-- (06/10 1025)   Genetic Screen  NIPS: normal XX Antibody:Negative (06/10 1025)  Anatomic [place]  Rubella: 3.49 (06/10 1025)   Varicella: Immune  GTT Early:               Third trimester: 108 RPR: Non Reactive (06/10 1025)   Rhogam  not needed HBsAg: Negative (06/10 1025)   TDaP vaccine                        Flu Shot: 05/06/18 HIV: Non Reactive (06/10 1025)   Baby Food    bottle                            GBS: positive  Contraception  undecided- given info Pap: v2017 NIL  CBB     CS/VBAC    Support Person             Maternal Medical History:   Past Medical History:  Diagnosis Date  . Hay fever   . History of HPV infection   . History of UTI   . Retained placenta     Past Surgical History:  Procedure Laterality Date  . DILATION AND CURETTAGE OF UTERUS N/A 12/17/2015   Procedure: DILATATION AND CURETTAGE;   Surgeon: Brayton Mars, MD;  Location: [facility] ORS;  Service: Gynecology;  Laterality: N/A;  . LEEP  2006    No Known Allergies  Prior to Admission medications   Medication Sig Start Date End Date Taking? Authorizing Provider  ferrous sulfate 325 (65 FE) MG tablet Take 325 mg by mouth daily with breakfast.   Yes [provider]  Prenatal Vit-Fe Fumarate-FA (PREPLUS) 27-1 MG TABS Take 1 tablet by mouth daily. 01/25/18  Yes [provider]  sertraline (ZOLOFT) 50 MG tablet Take 1 tablet by mouth daily. Reported on 01/13/2016   Yes [provider]  metroNIDAZOLE (METROCREAM) 0.75 % cream  12/17/17   [provider]      Social History: She  reports that she has quit smoking. She has never used smokeless tobacco. She reports current alcohol use. She reports that she does not use drugs.  Family History: family history includes Alcohol abuse in her father and another family member; Anemia in her mother; Aplastic anemia in her father; Heart disease in her father; Hypertension in her father; Stroke in her father.  Review of Systems: Negative x 10 systems reviewed except as noted in the HPI.      Physical Exam:  Vital Signs: BP 126/67 (BP Location: Right Arm)   Pulse (!) 125   Temp 98.4 F (36.9 C) (Oral)   Resp (!) 22   LMP 12/18/2017  Ht 64" General: gravid WF appears uncomfortable HEENT: normocephalic, atraumatic Heart:tachycardia & regular rhythm.  No murmurs Lungs: clear to auscultation bilaterally Abdomen: soft, gravid, mild TTP in upper abdomen;  Pelvic:   External: Normal external female genitalia  Cervix:closed/ long/ OOP  Extremities: non-tender, symmetric, no edema bilaterally.   Neurologic: Alert & oriented x 3.   Baseline FHR: 140s with accelerations to 170s, moderate variability (occasionally will pick up mother's pulse in 120s) Toco: mild contractions every 2-3 minutes at times  Results for orders placed or performed during the  hospital encounter of 09/02/18 (from the past 24 hour(s))  Urinalysis, Complete w Microscopic     Status: Abnormal   Collection Time: 09/02/18 11:33 PM  Result Value Ref Range   Color, Urine YELLOW (A) YELLOW   APPearance HAZY (A) CLEAR   Specific Gravity, Urine 1.019 1.005 - 1.030   pH 5.0 5.0 - 8.0   Glucose, UA NEGATIVE NEGATIVE mg/dL   Hgb urine dipstick NEGATIVE NEGATIVE   Bilirubin Urine NEGATIVE NEGATIVE   Ketones, ur 80 (A) NEGATIVE mg/dL   Protein, ur NEGATIVE NEGATIVE mg/dL   Nitrite NEGATIVE NEGATIVE   Leukocytes, UA NEGATIVE NEGATIVE   RBC / HPF 6-10 0 - 5 RBC/hpf   WBC, UA 0-5 0 - 5 WBC/hpf   Bacteria, UA RARE (A) NONE SEEN   Squamous Epithelial / LPF 0-5 0 - 5   Mucus PRESENT    Ca Oxalate Crys, UA PRESENT   Influenza panel by PCR (type A & B)     Status: None   Collection Time: 09/02/18 11:33 PM  Result Value Ref Range   Influenza A By PCR NEGATIVE NEGATIVE   Influenza B By PCR NEGATIVE NEGATIVE   Assessment/Plan:  AIJAH LATTNER is a 37 y.o. [id number] female at [redacted]w[redacted]d with nausea/ vomiting/ abdominal cramping and uterine irritability  Suspect acute gastroenteritis. -Initially administered Zofran 4 mgm ODT and Pepcid po-but in light of tachycardia/ ketonuria-will hydrate intravenously and get CBC and CMP and lipase. Differential also includes appendicitis, renal stones Julia Gay  09/03/2018 1:10 AM

## 2018-09-04 ENCOUNTER — Observation Stay (HOSPITAL_BASED_OUTPATIENT_CLINIC_OR_DEPARTMENT_OTHER)
Admit: 2018-09-04 | Discharge: 2018-09-04 | Disposition: A | Discharge status: Home / Self Care | Payer: Medicaid Other | Attending: Internal Medicine | Admitting: Internal Medicine

## 2018-09-04 DIAGNOSIS — I340 Nonrheumatic mitral (valve) insufficiency: Secondary | ICD-10-CM

## 2018-09-04 DIAGNOSIS — O212 Late vomiting of pregnancy: Secondary | ICD-10-CM | POA: Diagnosis not present

## 2018-09-04 LAB — URINE CULTURE
Culture: 20000 — AB
Special Requests: NORMAL

## 2018-09-04 LAB — T4, FREE: Free T4: 0.58 ng/dL — ABNORMAL LOW (ref 0.82–1.77)

## 2018-09-04 LAB — ECHOCARDIOGRAM COMPLETE

## 2018-09-04 LAB — TSH: TSH: 1.412 u[IU]/mL (ref 0.350–4.500)

## 2018-09-04 NOTE — Progress Notes (Signed)
Discharge instructions reviewed with patient. Pt verbalized understanding of d/c instructions and has no questions at this time. Pt instructed when to call provider or return to ED. Vitals WNL and pt discharged from facility with her husband.

## 2018-09-04 NOTE — Progress Notes (Signed)
Pt is ordering breakfast this morning. Pt states she has not had any diarrhea since last night before 2200 and states currently no nausea. Pt states she is "ready to get home and shower". Currently waiting on Echo Lab to perform Echocardiogram that was ordered yesterday. Nurse called the Echo lab at 0730 with no answer and will attempt to call again at 0800. Pt is comfortable at this time.

## 2018-09-04 NOTE — Discharge Summary (Signed)
See Final Progress Note 09/04/2018.

## 2018-09-04 NOTE — Progress Notes (Signed)
Pt transported downstairs for Echocardiogram.

## 2018-09-04 NOTE — Progress Notes (Signed)
Spoke with someone in Echo Lab. States they are trying to catch up orders from Friday. Was informed she would try to get patient around 1000 or 1030.

## 2018-09-04 NOTE — Final Progress Note (Signed)
Physician Final Progress Note  Patient ID: Julia Gay MRN: [id number] DOB/AGE: 01-28-1981 37 y.o.  Admit date: 09/02/2018 Admitting provider: Homero Fellers, MD Discharge date: 09/04/2018   Admission Diagnoses: Nausea and vomiting in pregnancy  Discharge Diagnoses: Supervision of high risk pregnancy  History of Present Illness: The patient is a 37 y.o. female [phone] at [redacted]w[redacted]d who presented for nausea and vomiting on the evening of 12/27. See H&P for details. She developed tachycardia and was monitored for an extended period. Her tachycardia has resolved and she has had a normal echocardiogram this morning. She feels much better and desires to discharge home.  Review of Systems: Review of systems negative unless otherwise noted in HPI.   Past Medical History:  Diagnosis Date  . Hay fever   . History of HPV infection   . History of UTI   . Retained placenta     Past Surgical History:  Procedure Laterality Date  . DILATION AND CURETTAGE OF UTERUS N/A 12/17/2015   Procedure: DILATATION AND CURETTAGE;  Surgeon: Brayton Mars, MD;  Location: [facility] ORS;  Service: Gynecology;  Laterality: N/A;  . LEEP  2006    No current facility-administered medications on file prior to encounter.    Current Outpatient Medications on File Prior to Encounter  Medication Sig Dispense Refill  . ferrous sulfate 325 (65 FE) MG tablet Take 325 mg by mouth daily with breakfast.    . Prenatal Vit-Fe Fumarate-FA (PREPLUS) 27-1 MG TABS Take 1 tablet by mouth daily.  2  . sertraline (ZOLOFT) 50 MG tablet Take 1 tablet by mouth daily. Reported on 01/13/2016      No Known Allergies  Social History   Socioeconomic History  . Marital status: Married    Spouse name: Not on file  . Number of children: Not on file  . Years of education: Not on file  . Highest education level: Not on file  Occupational History  . Not on file  Social Needs  . Financial resource strain: Not on file  . Food  insecurity:    Worry: Not on file    Inability: Not on file  . Transportation needs:    Medical: Not on file    Non-medical: Not on file  Tobacco Use  . Smoking status: Former Research scientist (life sciences)  . Smokeless tobacco: Never Used  . Tobacco comment: quit in 2004  Substance and Sexual Activity  . Alcohol use: Yes    Alcohol/week: 0.0 standard drinks    Comment: socially  . Drug use: No  . Sexual activity: Not Currently    Birth control/protection: None  Lifestyle  . Physical activity:    Days per week: Not on file    Minutes per session: Not on file  . Stress: Not on file  Relationships  . Social connections:    Talks on phone: Not on file    Gets together: Not on file    Attends religious service: Not on file    Active member of club or organization: Not on file    Attends meetings of clubs or organizations: Not on file    Relationship status: Not on file  . Intimate partner violence:    Fear of current or ex partner: Not on file    Emotionally abused: Not on file    Physically abused: Not on file    Forced sexual activity: Not on file  Other Topics Concern  . Not on file  Social History Narrative   Married.  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   Has 1 child.   Enjoys playing soccer and tennis, singing, watching TV.    Family history: See H&P   Physical Exam: BP 114/62   Pulse 94   Temp 97.9 F (36.6 C) (Oral)   Resp 16   LMP 12/18/2017   SpO2 98%   Gen: NAD CV: Regular rate Pulm: No increased work of breathing Pelvic: deferred Neuro: grossly intact  Fetus: Reactive NST   Consults: Hospitalist  Significant Findings/ Diagnostic Studies: ECG with non-specific reading, echocardiogram normal  Procedures: NST  Discharge Condition: good  Disposition:   Diet: Regular diet  Discharge Activity: Activity as tolerated  Discharge Instructions    Discharge activity:  No Restrictions   Complete by:  As directed    Discharge diet:  No restrictions    Complete by:  As directed    Fetal Kick Count:  Lie on our left side for one hour after a meal, and count the number of times your baby kicks.  If it is less than 5 times, get up, move around and drink some juice.  Repeat the test 30 minutes later.  If it is still less than 5 kicks in an hour, notify your doctor.   Complete by:  As directed    LABOR:  When conractions begin, you should start to time them from the beginning of one contraction to the beginning  of the next.  When contractions are 5 - 10 minutes apart or less and have been regular for at least an hour, you should call your health care provider.   Complete by:  As directed    No sexual activity restrictions   Complete by:  As directed    Notify physician for bleeding from the vagina   Complete by:  As directed    Notify physician for blurring of vision or spots before the eyes   Complete by:  As directed    Notify physician for chills or fever   Complete by:  As directed    Notify physician for fainting spells, "black outs" or loss of consciousness   Complete by:  As directed    Notify physician for increase in vaginal discharge   Complete by:  As directed    Notify physician for leaking of fluid   Complete by:  As directed    Notify physician for pain or burning when urinating   Complete by:  As directed    Notify physician for pelvic pressure (sudden increase)   Complete by:  As directed    Notify physician for severe or continued nausea or vomiting   Complete by:  As directed    Notify physician for sudden gushing of fluid from the vagina (with or without continued leaking)   Complete by:  As directed    Notify physician for sudden, constant, or occasional abdominal pain   Complete by:  As directed    Notify physician if baby moving less than usual   Complete by:  As directed      Allergies as of 09/04/2018   No Known Allergies     Medication List    STOP taking these medications   metroNIDAZOLE 0.75 %  cream Commonly known as:  METROCREAM     TAKE these medications   ferrous sulfate 325 (65 FE) MG tablet Take 325 mg by mouth daily with breakfast.   PREPLUS 27-1 MG Tabs Take 1 tablet by mouth daily.   sertraline  50 MG tablet Commonly known as:  ZOLOFT Take 1 tablet by mouth daily. Reported on 01/13/2016      Follow up in office for scheduled [place] appointment.  Signed: Rexene Agent, CNM  09/04/2018

## 2018-09-05 ENCOUNTER — Ambulatory Visit (INDEPENDENT_AMBULATORY_CARE_PROVIDER_SITE_OTHER): Payer: Medicaid Other | Admitting: Obstetrics & Gynecology

## 2018-09-05 VITALS — BP 128/82 | HR 91 | Wt 229.0 lb

## 2018-09-05 DIAGNOSIS — O09893 Supervision of other high risk pregnancies, third trimester: Secondary | ICD-10-CM

## 2018-09-05 DIAGNOSIS — O0993 Supervision of high risk pregnancy, unspecified, third trimester: Secondary | ICD-10-CM

## 2018-09-05 DIAGNOSIS — O09213 Supervision of pregnancy with history of pre-term labor, third trimester: Secondary | ICD-10-CM

## 2018-09-05 DIAGNOSIS — O09521 Supervision of elderly multigravida, first trimester: Secondary | ICD-10-CM

## 2018-09-05 DIAGNOSIS — Z3A37 37 weeks gestation of pregnancy: Secondary | ICD-10-CM

## 2018-09-05 LAB — T3, FREE: T3 FREE: 2.2 pg/mL (ref 2.0–4.4)

## 2018-09-05 NOTE — Progress Notes (Signed)
  Subjective  Fetal Movement? yes Contractions? no Leaking Fluid? no Vaginal Bleeding? no Seen for n/v over weekend, also evaluated for tachycardia (medicine seen and found no other etiology) Objective  BP 128/82   Pulse 91   Wt 229 lb (103.9 kg)   LMP 12/18/2017   BMI 39.31 kg/m  General: NAD Pumonary: no increased work of breathing Abdomen: gravid, non-tender Extremities: no edema Psychiatric: mood appropriate, affect full  Assessment  36 y.o. [id number] at [redacted]w[redacted]d by  09/24/2018, by Last Menstrual Period presenting for routine prenatal visit  Plan   Problem List Items Addressed This Visit      Other   AMA (advanced maternal age) multigravida 35+, first trimester   Supervision of high risk pregnancy, antepartum, first trimester    Other Visit Diagnoses    [redacted] weeks gestation of pregnancy    -  Primary   History of preterm delivery, currently pregnant in third trimester        PNV, [place] No more concerns for PTL Monitor for preeclamspia GBS tx needed in labor   Barnett Applebaum, MD, [place], [facility] 09/05/2018  4:34 PM

## 2018-09-07 NOTE — L&D Delivery Note (Addendum)
Date of delivery: 09/09/2018 Estimated Date of Delivery: 09/24/18 Patient's last menstrual period was 12/18/2017. EGA: [redacted]w[redacted]d  Delivery Note At 11:15 PM a viable female was delivered via Vaginal, Spontaneous (Presentation: OA; LOA).   APGAR: 7, 8; weight: 3480 g     Placenta status: spontaneous, intact.   Cord:  with the following complications: none.   Cord pH: NA  Called to see patient.  Mom pushed to deliver a viable female infant.  The head followed by shoulders, which delivered without difficulty, and the rest of the body.  No nuchal cord noted.  Baby to mom's chest. Cord pulsing well. Baby with spontaneous breathing but quiet. Cord clamped and cut after 3 min delay and baby taken to warmer for assessment. Suctioning for clear fluid and a few puffs given by face mask and O2 saturations improved. No cord blood obtained.  Placenta delivered spontaneously, intact, with a 3-vessel cord.  Perineum intact and no other lacerations.  All counts correct.  Hemostasis obtained with IV pitocin and fundal massage.    Anesthesia: Epidural   Episiotomy: None Lacerations: None Suture Repair: NA Est. Blood Loss (mL): 300  Mom to postpartum.   Baby girl Julia Ash "Eduard Clos" to Couplet care / Skin to Skin.   Julia Gay, CNM 09/09/2018, 11:43 PM

## 2018-09-08 ENCOUNTER — Telehealth: Payer: Self-pay

## 2018-09-08 NOTE — Telephone Encounter (Signed)
For the past several weeks has had heartburn that will not go away.  What to do?  [phone]  Adv Tums which pt has been taking and it doesn't help, rolaids, zantac mylanta; avoid fatty, fried, or spicy foods; eat small frequent meals 5-6 times daily as opposed to 3 large meals a day; protonics; elevate HOB 4-6in - sleep elevated; drink 1/4 cup milk before HS; lie on right side b/c stomach empties out on the right.  Try these measures and if they do not help to see about rx.

## 2018-09-09 ENCOUNTER — Inpatient Hospital Stay
Admission: EM | Admit: 2018-09-09 | Discharge: 2018-09-11 | DRG: 807 | Disposition: A | Discharge status: Home / Self Care | Payer: Medicaid Other | Attending: Obstetrics & Gynecology | Admitting: Obstetrics & Gynecology

## 2018-09-09 ENCOUNTER — Inpatient Hospital Stay: Payer: Medicaid Other | Admitting: Anesthesiology

## 2018-09-09 ENCOUNTER — Other Ambulatory Visit: Payer: Self-pay

## 2018-09-09 DIAGNOSIS — F429 Obsessive-compulsive disorder, unspecified: Secondary | ICD-10-CM | POA: Diagnosis present

## 2018-09-09 DIAGNOSIS — O99344 Other mental disorders complicating childbirth: Secondary | ICD-10-CM | POA: Diagnosis present

## 2018-09-09 DIAGNOSIS — O4292 Full-term premature rupture of membranes, unspecified as to length of time between rupture and onset of labor: Principal | ICD-10-CM | POA: Diagnosis present

## 2018-09-09 DIAGNOSIS — Z3A37 37 weeks gestation of pregnancy: Secondary | ICD-10-CM

## 2018-09-09 DIAGNOSIS — O99824 Streptococcus B carrier state complicating childbirth: Secondary | ICD-10-CM | POA: Diagnosis present

## 2018-09-09 DIAGNOSIS — O4212 Full-term premature rupture of membranes, onset of labor more than 24 hours following rupture: Secondary | ICD-10-CM

## 2018-09-09 DIAGNOSIS — O0991 Supervision of high risk pregnancy, unspecified, first trimester: Secondary | ICD-10-CM

## 2018-09-09 DIAGNOSIS — O4290 Premature rupture of membranes, unspecified as to length of time between rupture and onset of labor, unspecified weeks of gestation: Secondary | ICD-10-CM | POA: Diagnosis present

## 2018-09-09 DIAGNOSIS — Z3490 Encounter for supervision of normal pregnancy, unspecified, unspecified trimester: Secondary | ICD-10-CM

## 2018-09-09 LAB — CBC
HCT: 34.5 % — ABNORMAL LOW (ref 36.0–46.0)
HEMOGLOBIN: 11.1 g/dL — AB (ref 12.0–15.0)
MCH: 28 pg (ref 26.0–34.0)
MCHC: 32.2 g/dL (ref 30.0–36.0)
MCV: 87.1 fL (ref 80.0–100.0)
Platelets: 200 10*3/uL (ref 150–400)
RBC: 3.96 MIL/uL (ref 3.87–5.11)
RDW: 13.1 % (ref 11.5–15.5)
WBC: 13.3 10*3/uL — ABNORMAL HIGH (ref 4.0–10.5)
nRBC: 0 % (ref 0.0–0.2)

## 2018-09-09 LAB — TYPE AND SCREEN
ABO/RH(D): A POS
Antibody Screen: NEGATIVE

## 2018-09-09 LAB — RUPTURE OF MEMBRANE (ROM)PLUS: Rom Plus: POSITIVE

## 2018-09-09 MED ORDER — FENTANYL 2.5 MCG/ML W/ROPIVACAINE 0.15% IN NS 100 ML EPIDURAL (ARMC)
EPIDURAL | Status: DC | PRN
  Administered 2018-09-09: 12 mL/h via EPIDURAL

## 2018-09-09 MED ORDER — DIPHENHYDRAMINE HCL 50 MG/ML IJ SOLN: 12.5000 mg | INTRAMUSCULAR | Status: DC | PRN

## 2018-09-09 MED ORDER — ONDANSETRON HCL 4 MG/2ML IJ SOLN: 4.0000 mg | Freq: Four times a day (QID) | INTRAMUSCULAR | Status: DC | PRN

## 2018-09-09 MED ORDER — AMMONIA AROMATIC IN INHA
RESPIRATORY_TRACT | Status: AC
  Filled 2018-09-09: qty 10

## 2018-09-09 MED ORDER — BUTORPHANOL TARTRATE 1 MG/ML IJ SOLN: 1.0000 mg | INTRAMUSCULAR | Status: DC | PRN

## 2018-09-09 MED ORDER — ACETAMINOPHEN 325 MG PO TABS: 650.0000 mg | ORAL_TABLET | ORAL | Status: DC | PRN

## 2018-09-09 MED ORDER — PHENYLEPHRINE 40 MCG/ML (10ML) SYRINGE FOR IV PUSH (FOR BLOOD PRESSURE SUPPORT)
80.0000 ug | PREFILLED_SYRINGE | INTRAVENOUS | Status: DC | PRN
  Filled 2018-09-09: qty 10

## 2018-09-09 MED ORDER — OXYTOCIN 10 UNIT/ML IJ SOLN
INTRAMUSCULAR | Status: AC
  Filled 2018-09-09: qty 2

## 2018-09-09 MED ORDER — OXYTOCIN 40 UNITS IN LACTATED RINGERS INFUSION - SIMPLE MED
2.5000 [IU]/h | INTRAVENOUS | Status: DC
  Filled 2018-09-09: qty 1000

## 2018-09-09 MED ORDER — PENICILLIN G 3 MILLION UNITS IVPB - SIMPLE MED
3.0000 10*6.[IU] | INTRAVENOUS | Status: DC
  Administered 2018-09-09 (×2): 3 10*6.[IU] via INTRAVENOUS
  Filled 2018-09-09 (×3): qty 100
  Filled 2018-09-09: qty 3
  Filled 2018-09-09 (×3): qty 100
  Filled 2018-09-09: qty 3

## 2018-09-09 MED ORDER — OXYTOCIN BOLUS FROM INFUSION
500.0000 mL | Freq: Once | INTRAVENOUS | Status: AC
  Administered 2018-09-09: 500 mL via INTRAVENOUS

## 2018-09-09 MED ORDER — SODIUM CHLORIDE 0.9 % IV SOLN
5.0000 10*6.[IU] | Freq: Once | INTRAVENOUS | Status: AC
  Administered 2018-09-09: 5 10*6.[IU] via INTRAVENOUS
  Filled 2018-09-09: qty 5

## 2018-09-09 MED ORDER — OXYTOCIN 40 UNITS IN LACTATED RINGERS INFUSION - SIMPLE MED
1.0000 m[IU]/min | INTRAVENOUS | Status: DC
  Administered 2018-09-09: 9 m[IU]/min via INTRAVENOUS
  Administered 2018-09-09: 1 m[IU]/min via INTRAVENOUS

## 2018-09-09 MED ORDER — FAMOTIDINE IN NACL 20-0.9 MG/50ML-% IV SOLN
20.0000 mg | Freq: Two times a day (BID) | INTRAVENOUS | Status: DC
  Administered 2018-09-09: 20 mg via INTRAVENOUS
  Filled 2018-09-09 (×2): qty 50

## 2018-09-09 MED ORDER — LACTATED RINGERS IV SOLN
INTRAVENOUS | Status: DC
  Administered 2018-09-09 (×2): via INTRAVENOUS

## 2018-09-09 MED ORDER — LIDOCAINE HCL (PF) 1 % IJ SOLN
INTRAMUSCULAR | Status: AC
  Filled 2018-09-09: qty 30

## 2018-09-09 MED ORDER — OXYTOCIN 40 UNITS IN LACTATED RINGERS INFUSION - SIMPLE MED: 1.0000 m[IU]/min | INTRAVENOUS | Status: DC

## 2018-09-09 MED ORDER — LACTATED RINGERS IV SOLN
500.0000 mL | INTRAVENOUS | Status: DC | PRN
  Administered 2018-09-09: 1000 mL via INTRAVENOUS

## 2018-09-09 MED ORDER — EPHEDRINE 5 MG/ML INJ
10.0000 mg | INTRAVENOUS | Status: DC | PRN
  Filled 2018-09-09: qty 2

## 2018-09-09 MED ORDER — TERBUTALINE SULFATE 1 MG/ML IJ SOLN: 0.2500 mg | Freq: Once | INTRAMUSCULAR | Status: DC | PRN

## 2018-09-09 MED ORDER — FENTANYL 2.5 MCG/ML W/ROPIVACAINE 0.15% IN NS 100 ML EPIDURAL (ARMC): 12.0000 mL/h | EPIDURAL | Status: DC

## 2018-09-09 MED ORDER — FENTANYL 2.5 MCG/ML W/ROPIVACAINE 0.15% IN NS 100 ML EPIDURAL (ARMC)
EPIDURAL | Status: AC
  Filled 2018-09-09: qty 100

## 2018-09-09 MED ORDER — LACTATED RINGERS IV SOLN: 500.0000 mL | Freq: Once | INTRAVENOUS | Status: DC

## 2018-09-09 MED ORDER — MISOPROSTOL 200 MCG PO TABS
ORAL_TABLET | ORAL | Status: AC
  Filled 2018-09-09: qty 4

## 2018-09-09 MED ORDER — LIDOCAINE HCL (PF) 1 % IJ SOLN
INTRAMUSCULAR | Status: DC | PRN
  Administered 2018-09-09: 1.2 mL via SUBCUTANEOUS

## 2018-09-09 MED ORDER — CALCIUM CARBONATE ANTACID 500 MG PO CHEW
1.0000 | CHEWABLE_TABLET | Freq: Three times a day (TID) | ORAL | Status: DC
  Administered 2018-09-09 (×4): 200 mg via ORAL
  Filled 2018-09-09 (×3): qty 1

## 2018-09-09 MED ORDER — CALCIUM CARBONATE ANTACID 500 MG PO CHEW
CHEWABLE_TABLET | ORAL | Status: AC
  Administered 2018-09-09: 200 mg via ORAL
  Filled 2018-09-09: qty 2

## 2018-09-09 MED ORDER — LIDOCAINE-EPINEPHRINE (PF) 1.5 %-1:200000 IJ SOLN
INTRAMUSCULAR | Status: DC | PRN
  Administered 2018-09-09: 3 mL via EPIDURAL

## 2018-09-09 NOTE — H&P (Signed)
Obstetrics Admission History & Physical   Rupture of Membranes and Abdominal Cramping   HPI:  38 y.o. [id number] @ 38 y.o. S8N4627 @ [redacted]w[redacted]d (09/24/2018, by Last Menstrual Period). Admitted on 09/09/2018:   Patient Active Problem List   Diagnosis Date Noted  . Pregnancy 09/09/2018  . Prolonged rupture of membranes 09/09/2018  . History of preterm delivery, currently pregnant, unspecified trimester 04/08/2018  . Supervision of high risk pregnancy, antepartum, first trimester 02/14/2018  . History of pre-eclampsia in prior pregnancy, currently pregnant in first trimester 02/14/2018  . Vitamin D deficiency 10/10/2015  . AMA (advanced maternal age) multigravida 63+, first trimester 06/13/2015  . Preventative health care 05/21/2015  . Obsessive compulsive disorder 04/17/2015    Presents for Leakage of fluid since last night around midnight, mild ctx pains.   [facility]atal care at: at [facility]. Pregnancy complicated by prior preeclampsia, prior PPROM and PTD (received 17-OHP), AMA, depression.  ROS: A review of systems was performed and negative, except as stated in the above HPI. PMHx:  Past Medical History:  Diagnosis Date  . Hay fever   . History of HPV infection   . History of UTI   . Retained placenta    PSHx:  Past Surgical History:  Procedure Laterality Date  . DILATION AND CURETTAGE OF UTERUS N/A 12/17/2015   Procedure: DILATATION AND CURETTAGE;  Surgeon: Brayton Mars, MD;  Location: [facility] ORS;  Service: Gynecology;  Laterality: N/A;  . LEEP  2006   Medications:  Medications Prior to Admission  Medication Sig Dispense Refill Last Dose  . Prenatal Vit-Fe Fumarate-FA (PREPLUS) 27-1 MG TABS Take 1 tablet by mouth daily.  2 09/08/2018 at Unknown time  . sertraline (ZOLOFT) 50 MG tablet Take 1 tablet by mouth daily. Reported on 01/13/2016   09/08/2018 at Unknown time  . ferrous sulfate 325 (65 FE) MG tablet Take 325 mg by mouth daily with breakfast.   Unknown at Unknown time   Allergies: has No Known  Allergies. OBHx:  OB History  Gravida Para Term Preterm AB Living  4 2 1 1 1 2   SAB TAB Ectopic Multiple Live Births  1     0 2    # Outcome Date GA Lbr Len/2nd Weight Sex Delivery Anes PTL Lv  4 Current           3 Preterm 12/02/15 [redacted]w[redacted]d / 00:33 3400 g M Vag-Spont EPI  LIV  2 SAB 12/2014        FD  1 Term 01/17/02 [redacted]w[redacted]d  3345 g M Vag-Spont  N LIV     Complications: Preeclampsia complicating hypertension   OJJ:KKXFGHWE/XHBZJIRCVELF except as detailed in HPI.Marland Kitchen  No family history of birth defects. Soc Hx: Alcohol: none and Recreational drug use: none  Objective:   Vitals:   09/09/18 0520  BP: (!) 131/91  Pulse: (!) 106  Resp: 16  Temp: 98.1 F (36.7 C)   Constitutional: Well nourished, well developed female in no acute distress.  HEENT: normal Skin: Warm and dry.  Cardiovascular:Regular rate and rhythm.   Extremity: trace to 1+ bilateral pedal edema Respiratory: Clear to auscultation bilateral. Normal respiratory effort Abdomen: gravid NT, VTX Back: no CVAT Neuro: DTRs 2+, Cranial nerves grossly intact Psych: Alert and Oriented x3. No memory deficits. Normal mood and affect.  MS: normal gait, normal bilateral lower extremity ROM/strength/stability.  Pelvic exam: is not limited by body habitus EGBUS: within normal limits Vagina: within normal limits and with normal mucosa blood in the vault Cervix: 3 cm ext os,  1 cm int os, 60%, high    SSE- ROM +, FERN POS Uterus: Spontaneous uterine activity  Adnexa: not evaluated  EFM:FHR: 140 bpm, variability: moderate,  accelerations:  Present,  decelerations:  Absent Toco: Frequency: Every 2 minutes  US DONE-    VTX  Perinatal info:  Blood type: A positive Rubella- Immune Varicella -Immune TDaP Given during third trimester of this pregnancy RPR NR / HIV Neg/ HBsAg Neg   Assessment & Plan:   38 y.o. [id number] @ [redacted]w[redacted]d, Admitted on 09/09/2018:Early labor with prolonged rupture of membranes    Admit for labor, Antibiotics  for GBS prophylaxis, Fetal Wellbeing Reassuring and Epidural when ready Also, Pitocin as prolonged rupture of membranes  Barnett Applebaum, MD, [facility], [facility] 09/09/2018  7:11 AM

## 2018-09-09 NOTE — Anesthesia Preprocedure Evaluation (Signed)
Anesthesia Evaluation  Patient identified by MRN, date of birth, ID band Patient awake    Reviewed: Allergy & Precautions, NPO status , Patient's Chart, lab work & pertinent test results  History of Anesthesia Complications Negative for: history of anesthetic complications  Airway Mallampati: II       Dental   Pulmonary neg sleep apnea, neg COPD, former smoker,           Cardiovascular (-) hypertension(-) Past MI and (-) CHF (-) dysrhythmias (-) Valvular Problems/Murmurs     Neuro/Psych neg Seizures Anxiety    GI/Hepatic Neg liver ROS, GERD  ,  Endo/Other  neg diabetes  Renal/GU negative Renal ROS     Musculoskeletal   Abdominal   Peds  Hematology   Anesthesia Other Findings   Reproductive/Obstetrics (+) Pregnancy                             Anesthesia Physical Anesthesia Plan  ASA: II  Anesthesia Plan: Epidural   Post-op Pain Management:    Induction:   PONV Risk Score and Plan:   Airway Management Planned:   Additional Equipment:   Intra-op Plan:   Post-operative Plan:   Informed Consent: I have reviewed the patients History and Physical, chart, labs and discussed the procedure including the risks, benefits and alternatives for the proposed anesthesia with the patient or authorized representative who has indicated his/her understanding and acceptance.     Plan Discussed with:   Anesthesia Plan Comments:         Anesthesia Quick Evaluation

## 2018-09-09 NOTE — OB Triage Note (Signed)
Pt presents to unit via ED c/o of LOF, spotting, and cramping that began around 0000. Pt rates pain from cramps at 5/10. Pt reports positive fetal movement, denies vaginal bleeding. External monitors applied and assessing, vital signs WNL. Will continue to monitor.

## 2018-09-09 NOTE — Discharge Summary (Signed)
OB Discharge Summary     Patient Name: Julia Gay DOB: 25-Feb-1981 MRN: [id number]  Date of admission: 09/09/2018 Delivering provider: Rod Can, CNM  Date of Delivery: 09/09/2018  Date of discharge: 09/11/2018  Admitting diagnosis: Pregnancy, premature rupture of membranes Intrauterine pregnancy: [redacted]w[redacted]d     Secondary diagnosis: None     Discharge diagnosis: Term Pregnancy Delivered                                                                                                Post partum procedures:none  Augmentation: Pitocin  Complications: None  Hospital course:  Onset of Labor With Vaginal Delivery      38 y.o. yo [id number] at [redacted]w[redacted]d was admitted in Latent Labor on 09/09/2018.  Patient had an uncomplicated labor course as follows:  Membrane Rupture Time/Date: 12:00 AM ,09/09/2018   Patient had delivery of viable female 11:15, 09/09/2018 See delivery note for details of delivery.  Patient had an uncomplicated postpartum course.   She is ambulating and voiding without difficulty. She is tolerating PO intake and her pain is well controlled with PO pain medication. Patient is discharged home in stable condition on 09/11/2018   Physical exam  Vitals:   09/10/18 1155 09/10/18 1623 09/11/18 0005 09/11/18 0835  BP: 121/80 112/70 122/80 118/66  Pulse: 76 72 69 68  Resp:  18  18  Temp: (!) 97.4 F (36.3 C) 97.6 F (36.4 C) 97.6 F (36.4 C) 98 F (36.7 C)  TempSrc: Oral Oral Axillary Oral  SpO2: 100% 100% 100% 100%  Weight:      Height:       General: alert, cooperative and no distress Lochia: appropriate Uterine Fundus: firm Incision: N/A DVT Evaluation: No evidence of DVT seen on physical exam.  Labs: Lab Results  Component Value Date   WBC 14.3 (H) 09/10/2018   HGB 10.2 (L) 09/10/2018   HCT 31.8 (L) 09/10/2018   MCV 86.9 09/10/2018   PLT 189 09/10/2018    Discharge instruction: per After Visit Summary.  Medications:  Allergies as of 09/11/2018   No Known  Allergies     Medication List    TAKE these medications   ferrous sulfate 325 (65 FE) MG tablet Take 325 mg by mouth daily with breakfast.   PREPLUS 27-1 MG Tabs Take 1 tablet by mouth daily.   sertraline 50 MG tablet Commonly known as:  ZOLOFT Take 1 tablet by mouth daily. Reported on 01/13/2016   vitamin B-12 500 MCG tablet Commonly known as:  CYANOCOBALAMIN Take 500 mcg by mouth daily.       Diet: routine diet  Activity: Advance as tolerated. Pelvic rest for 6 weeks.   Outpatient follow up: Follow-up Information    Rod Can, CNM. Schedule an appointment as soon as possible for a visit in 6 week(s).   Specialty:  Obstetrics Why:  postpartum follow up visit Contact information: [place] [address] [phone]             Postpartum contraception: unsure at this time Rhogam Given postpartum: NA Rubella vaccine given postpartum: Rubella Immune  Varicella vaccine given postpartum: Varicella Immune TDaP given antepartum or postpartum: Antepartum  Newborn Data: Live born female: Baldo Ash "Eduard Clos"  Birth Weight: 3480 g  APGAR: 7,   Newborn Delivery   Birth date/time:  09/09/2018 23:15:00 Delivery type:  Vaginal, Spontaneous      Baby Feeding: Formula  Disposition:home with mother  SIGNED:  Hoyt Koch, MD 09/11/2018 11:05 AM

## 2018-09-09 NOTE — Anesthesia Procedure Notes (Signed)
Epidural Patient location during procedure: OB Start time: 09/09/2018 4:26 PM End time: 09/09/2018 4:49 PM  Staffing Performed: anesthesiologist   Preanesthetic Checklist Completed: patient identified, site marked, surgical consent, pre-op evaluation, timeout performed, IV checked, risks and benefits discussed and monitors and equipment checked  Epidural Patient position: sitting Prep: Betadine Patient monitoring: heart rate, continuous pulse ox and blood pressure Approach: midline Location: L3-L4 Injection technique: LOR saline  Needle:  Needle type: Tuohy  Needle gauge: 17 G Needle length: 9 cm and 9 Needle insertion depth: 8 cm Catheter type: closed end flexible Catheter size: 20 Guage Test dose: negative and 1.5% lidocaine with Epi 1:200 K  Assessment Events: blood not aspirated, injection not painful, no injection resistance, negative IV test and no paresthesia  Additional Notes   Patient tolerated the insertion well without complications.Reason for block:procedure for pain

## 2018-09-09 NOTE — Progress Notes (Signed)
  Labor Progress Note   38 y.o. [id number] @ [redacted]w[redacted]d , admitted for  Pregnancy, Labor Management. PROM  Subjective:  Patient feels mild contractions. She has been on birth ball for past hour.  Objective:  BP 129/77   Pulse (!) 103   Temp 98.2 F (36.8 C) (Oral)   Resp 16   Ht 5\' 6"  (1.676 m)   Wt 100.2 kg   LMP 12/18/2017   BMI 35.67 kg/m  Abd: mild Extr: trace to 1+ bilateral pedal edema SVE: CERVIX: 4 cm dilated, 70 effaced, -3 station  EFM: FHR: 135 bpm, variability: moderate,  accelerations:  Present,  decelerations:  Absent Toco: difficult to assess while patient has been on birth ball Labs: I have reviewed the patient's lab results.   Assessment & Plan:  [id number] @ [redacted]w[redacted]d, admitted for  Pregnancy and Labor/Delivery Management  1. Pain management: none. 2. FWB: FHT category I.  3. ID: GBS positive: penicillin prophylaxis 4. Labor management: start pitocin  All discussed with patient, see orders   Rod Can, [facility], [facility] 09/09/2018  12:27 PM

## 2018-09-10 LAB — CBC
HEMATOCRIT: 31.8 % — AB (ref 36.0–46.0)
Hemoglobin: 10.2 g/dL — ABNORMAL LOW (ref 12.0–15.0)
MCH: 27.9 pg (ref 26.0–34.0)
MCHC: 32.1 g/dL (ref 30.0–36.0)
MCV: 86.9 fL (ref 80.0–100.0)
Platelets: 189 10*3/uL (ref 150–400)
RBC: 3.66 MIL/uL — ABNORMAL LOW (ref 3.87–5.11)
RDW: 13.2 % (ref 11.5–15.5)
WBC: 14.3 10*3/uL — ABNORMAL HIGH (ref 4.0–10.5)
nRBC: 0 % (ref 0.0–0.2)

## 2018-09-10 MED ORDER — IBUPROFEN 600 MG PO TABS
ORAL_TABLET | ORAL | Status: AC
  Filled 2018-09-10: qty 1

## 2018-09-10 MED ORDER — DIPHENHYDRAMINE HCL 25 MG PO CAPS: 25.0000 mg | ORAL_CAPSULE | Freq: Four times a day (QID) | ORAL | Status: DC | PRN

## 2018-09-10 MED ORDER — ONDANSETRON HCL 4 MG PO TABS: 4.0000 mg | ORAL_TABLET | ORAL | Status: DC | PRN

## 2018-09-10 MED ORDER — COCONUT OIL OIL: 1.0000 "application " | TOPICAL_OIL | Status: DC | PRN

## 2018-09-10 MED ORDER — IBUPROFEN 600 MG PO TABS
600.0000 mg | ORAL_TABLET | Freq: Four times a day (QID) | ORAL | Status: DC
  Administered 2018-09-10 (×2): 600 mg via ORAL
  Filled 2018-09-10: qty 1

## 2018-09-10 MED ORDER — PRENATAL MULTIVITAMIN CH
1.0000 | ORAL_TABLET | Freq: Every day | ORAL | Status: DC
  Administered 2018-09-10: 1 via ORAL
  Filled 2018-09-10: qty 1

## 2018-09-10 MED ORDER — SIMETHICONE 80 MG PO CHEW: 80.0000 mg | CHEWABLE_TABLET | ORAL | Status: DC | PRN

## 2018-09-10 MED ORDER — SENNOSIDES-DOCUSATE SODIUM 8.6-50 MG PO TABS
2.0000 | ORAL_TABLET | ORAL | Status: DC
  Administered 2018-09-10 – 2018-09-11 (×2): 2 via ORAL
  Filled 2018-09-10 (×2): qty 2

## 2018-09-10 MED ORDER — DIBUCAINE 1 % RE OINT: 1.0000 "application " | TOPICAL_OINTMENT | RECTAL | Status: DC | PRN

## 2018-09-10 MED ORDER — WITCH HAZEL-GLYCERIN EX PADS: 1.0000 "application " | MEDICATED_PAD | CUTANEOUS | Status: DC | PRN

## 2018-09-10 MED ORDER — ACETAMINOPHEN 325 MG PO TABS
650.0000 mg | ORAL_TABLET | ORAL | Status: DC | PRN
  Administered 2018-09-10: 650 mg via ORAL
  Filled 2018-09-10: qty 2

## 2018-09-10 MED ORDER — ONDANSETRON HCL 4 MG/2ML IJ SOLN: 4.0000 mg | INTRAMUSCULAR | Status: DC | PRN

## 2018-09-10 MED ORDER — IBUPROFEN 600 MG PO TABS
600.0000 mg | ORAL_TABLET | Freq: Four times a day (QID) | ORAL | Status: DC
  Administered 2018-09-10 – 2018-09-11 (×4): 600 mg via ORAL
  Filled 2018-09-10 (×4): qty 1

## 2018-09-10 MED ORDER — BENZOCAINE-MENTHOL 20-0.5 % EX AERO: 1.0000 "application " | INHALATION_SPRAY | CUTANEOUS | Status: DC | PRN

## 2018-09-10 NOTE — Progress Notes (Signed)
Admit Date: 09/09/2018 Today's Date: 09/10/2018  Post Partum Day 1  Subjective:  no complaints, up ad lib, voiding and tolerating PO  Objective: Temp:  [98 F (36.7 C)-98.8 F (37.1 C)] 98.4 F (36.9 C) (01/04 0737) Pulse Rate:  [78-123] 78 (01/04 0737) Resp:  [16-20] 18 (01/04 0737) BP: (106-142)/(51-86) 117/83 (01/04 0737) SpO2:  [95 %-100 %] 99 % (01/04 0737)  Physical Exam:  General: alert, cooperative and no distress Lochia: appropriate Uterine Fundus: firm Incision: none DVT Evaluation: No evidence of DVT seen on physical exam. Negative Homan's sign.  Recent Labs    09/09/18 0923 09/10/18 0503  HGB 11.1* 10.2*  HCT 34.5* 31.8*    Assessment/Plan: Plan for discharge tomorrow, Contraception (uncertain), Bottle Feeding and Infant doing well  BP under good control this am   LOS: 1 day   [place] 09/10/2018, 9:34 AM

## 2018-09-10 NOTE — Anesthesia Postprocedure Evaluation (Signed)
Anesthesia Post Note  Patient: Julia Gay  Procedure(s) Performed: AN [place]  Patient location during evaluation: Mother Baby Anesthesia Type: Epidural Level of consciousness: awake and alert Pain management: pain level controlled Respiratory status: nonlabored ventilation and spontaneous breathing Postop Assessment: no headache and epidural receding (sensation and strength reported as normal) Anesthetic complications: no     Last Vitals:  Vitals:   09/10/18 0345 09/10/18 0737  BP: 113/72 117/83  Pulse: 91 78  Resp:  18  Temp: 36.8 C 36.9 C  SpO2: 97% 99%    Last Pain:  Vitals:   09/10/18 1130  TempSrc:   PainSc: [place]

## 2018-09-11 NOTE — Progress Notes (Signed)
Admit Date: 09/09/2018 Today's Date: 09/11/2018  Post Partum Day 2  Subjective:  no complaints, up ad lib, voiding and tolerating PO  Objective: Temp:  [97.4 F (36.3 C)-98 F (36.7 C)] 98 F (36.7 C) (01/05 0835) Pulse Rate:  [68-76] 68 (01/05 0835) Resp:  [18] 18 (01/05 0835) BP: (112-122)/(66-80) 118/66 (01/05 0835) SpO2:  [100 %] 100 % (01/05 0835)  Physical Exam:  General: alert, cooperative and no distress Lochia: appropriate Uterine Fundus: firm Incision: none DVT Evaluation: No evidence of DVT seen on physical exam.  Recent Labs    09/09/18 0923 09/10/18 0503  HGB 11.1* 10.2*  HCT 34.5* 31.8*    Assessment/Plan: Discharge home, Bottle Feeding and Infant doing well   LOS: 2 days   [place] 09/11/2018, 11:05 AM

## 2018-09-11 NOTE — Progress Notes (Signed)
Discharge instructions given. Patient verbalizes understanding of teaching. Patient discharged home via wheelchair at 1250.

## 2018-09-11 NOTE — Discharge Instructions (Signed)
Vaginal Delivery, Care After °Refer to this sheet in the next few weeks. These instructions provide you with information about caring for yourself after vaginal delivery. Your health care provider may also give you more specific instructions. Your treatment has been planned according to current medical practices, but problems sometimes occur. Call your health care provider if you have any problems or questions. °What can I expect after the procedure? °After vaginal delivery, it is common to have: °· Some bleeding from your vagina. °· Soreness in your abdomen, your vagina, and the area of skin between your vaginal opening and your anus (perineum). °· Pelvic cramps. °· Fatigue. °Follow these instructions at home: °Medicines °· Take over-the-counter and prescription medicines only as told by your health care provider. °· If you were prescribed an antibiotic medicine, take it as told by your health care provider. Do not stop taking the antibiotic until it is finished. °Driving ° °· Do not drive or operate heavy machinery while taking prescription pain medicine. °· Do not drive for 24 hours if you received a sedative. °Lifestyle °· Do not drink alcohol. This is especially important if you are breastfeeding or taking medicine to relieve pain. °· Do not use tobacco products, including cigarettes, chewing tobacco, or e-cigarettes. If you need help quitting, ask your health care provider. °Eating and drinking °· Drink at least 8 eight-ounce glasses of water every day unless you are told not to by your health care provider. If you choose to breastfeed your baby, you may need to drink more water than this. °· Eat high-fiber foods every day. These foods may help prevent or relieve constipation. High-fiber foods include: °? Whole grain cereals and breads. °? Brown rice. °? Beans. °? Fresh fruits and vegetables. °Activity °· Return to your normal activities as told by your health care provider. Ask your health care provider what  activities are safe for you. °· Rest as much as possible. Try to rest or take a nap when your baby is sleeping. °· Do not lift anything that is heavier than your baby or 10 lb (4.5 kg) until your health care provider says that it is safe. °· Talk with your health care provider about when you can engage in sexual activity. This may depend on your: °? Risk of infection. °? Rate of healing. °? Comfort and desire to engage in sexual activity. °Vaginal Care °· If you have an episiotomy or a vaginal tear, check the area every day for signs of infection. Check for: °? More redness, swelling, or pain. °? More fluid or blood. °? Warmth. °? Pus or a bad smell. °· Do not use tampons or douches until your health care provider says this is safe. °· Watch for any blood clots that may pass from your vagina. These may look like clumps of dark red, brown, or black discharge. °General instructions °· Keep your perineum clean and dry as told by your health care provider. °· Wear loose, comfortable clothing. °· Wipe from front to back when you use the toilet. °· Ask your health care provider if you can shower or take a bath. If you had an episiotomy or a perineal tear during labor and delivery, your health care provider may tell you not to take baths for a certain length of time. °· Wear a bra that supports your breasts and fits you well. °· If possible, have someone help you with household activities and help care for your baby for at least a few days after you   leave the hospital. °· Keep all follow-up visits for you and your baby as told by your health care provider. This is important. °Contact a health care provider if: °· You have: °? Vaginal discharge that has a bad smell. °? Difficulty urinating. °? Pain when urinating. °? A sudden increase or decrease in the frequency of your bowel movements. °? More redness, swelling, or pain around your episiotomy or vaginal tear. °? More fluid or blood coming from your episiotomy or vaginal  tear. °? Pus or a bad smell coming from your episiotomy or vaginal tear. °? A fever. °? A rash. °? Little or no interest in activities you used to enjoy. °? Questions about caring for yourself or your baby. °· Your episiotomy or vaginal tear feels warm to the touch. °· Your episiotomy or vaginal tear is separating or does not appear to be healing. °· Your breasts are painful, hard, or turn red. °· You feel unusually sad or worried. °· You feel nauseous or you vomit. °· You pass large blood clots from your vagina. If you pass a blood clot from your vagina, save it to show to your health care provider. Do not flush blood clots down the toilet without having your health care provider look at them. °· You urinate more than usual. °· You are dizzy or light-headed. °· You have not breastfed at all and you have not had a menstrual period for 12 weeks after delivery. °· You have stopped breastfeeding and you have not had a menstrual period for 12 weeks after you stopped breastfeeding. °Get help right away if: °· You have: °? Pain that does not go away or does not get better with medicine. °? Chest pain. °? Difficulty breathing. °? Blurred vision or spots in your vision. °? Thoughts about hurting yourself or your baby. °· You develop pain in your abdomen or in one of your legs. °· You develop a severe headache. °· You faint. °· You bleed from your vagina so much that you fill two sanitary pads in one hour. °This information is not intended to replace advice given to you by your health care provider. Make sure you discuss any questions you have with your health care provider. °Document Released: 08/21/2000 Document Revised: 02/05/2016 Document Reviewed: 09/08/2015 °Elsevier Interactive Patient Education © 2019 Elsevier Inc. ° °

## 2018-09-12 ENCOUNTER — Encounter: Payer: Medicaid Other | Admitting: Obstetrics and Gynecology

## 2018-09-12 LAB — RPR, QUANT+TP ABS (REFLEX): T Pallidum Abs: NONREACTIVE

## 2018-09-12 LAB — RPR: RPR Ser Ql: REACTIVE — AB

## 2018-09-13 ENCOUNTER — Encounter: Payer: Self-pay | Admitting: Maternal Newborn

## 2018-09-13 ENCOUNTER — Ambulatory Visit (INDEPENDENT_AMBULATORY_CARE_PROVIDER_SITE_OTHER): Payer: Medicaid Other | Admitting: Maternal Newborn

## 2018-09-13 ENCOUNTER — Telehealth: Payer: Self-pay

## 2018-09-13 VITALS — BP 130/80 | Ht 64.0 in | Wt 208.0 lb

## 2018-09-13 DIAGNOSIS — F418 Other specified anxiety disorders: Secondary | ICD-10-CM

## 2018-09-13 DIAGNOSIS — O99345 Other mental disorders complicating the puerperium: Secondary | ICD-10-CM

## 2018-09-13 MED ORDER — ALPRAZOLAM 0.25 MG PO TABS: 0.2500 mg | ORAL_TABLET | Freq: Every evening | ORAL | 0 refills | Status: DC | PRN

## 2018-09-13 NOTE — Progress Notes (Signed)
Postpartum Visit  S: Julia Gay is tearful and concerned that she is having frequent symptoms of anxiety since she was discharged from the hospital with her newborn. She feels very emotional and cries "for no reason." She has been worried about the baby and finds it difficult to get to sleep due to her fears. She feels that her fears are irrational but has trouble getting them out of her mind. She says that she has not had symptoms like these before during the postpartum period, and she is worried that they are not normal. She says that she does not feel depressed and that her sertraline continues to work well for her. Feels that she has good family support and expresses love for and appropriate bonding behaviors with her newborn.  O: Blood pressure 130/80, height 5\' 4"  (1.626 m), weight 208 lb (94.3 kg), currently breastfeeding.  GAD Score = 11 EPDS Score = 7  Physical Exam Constitutional:      Appearance: Normal appearance.     Comments: Tearful  HENT:     Head: Normocephalic.  Pulmonary:     Effort: Pulmonary effort is normal.  Neurological:     Mental Status: She is oriented to person, place, and time.  Psychiatric:        Behavior: Behavior normal.        Thought Content: Thought content normal.        Judgment: Judgment normal.   A: 38 year old [id number] with postpartum anxiety.  P: Discussed that some of her symptoms such as frequent crying may be due to the nature of postpartum hormonal fluctuations. However, she does have an elevated GAD score and describes heightened feelings of anxiety.   She would like to try a short term medication to lessen anxiety and help her get some rest. We will try a low dose of alprazolam to be taken at bedtime; she is aware that it may make her more drowsy than usual.   She will follow up in one week for a mood check and let us know if she has any acute worsening of symptoms before that time.  Avel Sensor, CNM 09/13/2018

## 2018-09-13 NOTE — Telephone Encounter (Signed)
Pt feels like she has baby blues.  [phone]  Left detailed msg to call and schedule appt.

## 2018-09-13 NOTE — Progress Notes (Signed)
Results of RPR and TP reviewed. TP Ab neg.  RPR titre 1:1. Likely false pos.  Test was neg in Oct 2019 [place] retest in postpartum visit.  Barnett Applebaum, MD, [facility], [facility] 09/13/2018  9:32 AM

## 2018-09-14 ENCOUNTER — Encounter: Payer: Self-pay | Admitting: Maternal Newborn

## 2018-09-15 ENCOUNTER — Telehealth: Payer: Self-pay

## 2018-09-15 NOTE — Telephone Encounter (Signed)
Pt called after hour nurse at 3:26am today c/o severe abdominal pain; delivered Friday. [phone]  [place].

## 2018-09-16 NOTE — Telephone Encounter (Signed)
Pt has question's about syphilis results, she states the ABD pain she was having is gone.

## 2018-09-16 NOTE — Telephone Encounter (Signed)
Pt aware.  States she did have a viral inf at time test was drawn. Doesn't know if that would have anything to do with it being elevated or not.

## 2018-09-16 NOTE — Telephone Encounter (Signed)
Pt has questions about test results on MyChart.  Please call.  [phone]

## 2018-09-16 NOTE — Telephone Encounter (Signed)
Let her know test was falsely elevated, the difinitive test was negative.  We will retest at post partum check to be sure, but I am confident all is normal.

## 2018-09-21 ENCOUNTER — Ambulatory Visit (INDEPENDENT_AMBULATORY_CARE_PROVIDER_SITE_OTHER): Payer: Medicaid Other | Admitting: Maternal Newborn

## 2018-09-21 ENCOUNTER — Encounter: Payer: Self-pay | Admitting: Maternal Newborn

## 2018-09-21 ENCOUNTER — Ambulatory Visit: Payer: Medicaid Other | Admitting: Advanced Practice Midwife

## 2018-09-21 ENCOUNTER — Ambulatory Visit: Payer: Medicaid Other | Admitting: Maternal Newborn

## 2018-09-21 VITALS — BP 112/80 | HR 79 | Ht 64.0 in | Wt 202.0 lb

## 2018-09-21 DIAGNOSIS — O99345 Other mental disorders complicating the puerperium: Secondary | ICD-10-CM

## 2018-09-21 DIAGNOSIS — F418 Other specified anxiety disorders: Secondary | ICD-10-CM

## 2018-09-21 NOTE — Progress Notes (Signed)
Postpartum Visit - Follow up Anxiety  S: Julia Gay is cheerful and smiling. She states that she feels "much better" since her visit last week. She is no longer tearful and feeling worried all the time. She did not need to take the alprazolam that was prescribed to help with sleep and anxiety. She says that she sometimes feels anxious, but that it seems to be within normal expectations for postpartum and she is not concerned about these symptoms now. She is not having problems with depression.  O: Blood pressure 112/80, pulse 79, height 5\' 4"  (1.626 m), weight 202 lb (91.6 kg).  GAD Score = 2 EPDS Score = 4  Physical Exam Constitutional:      General: She is not in acute distress.    Appearance: Normal appearance.  HENT:     Head: Normocephalic and atraumatic.  Pulmonary:     Effort: Pulmonary effort is normal.  Neurological:     Mental Status: She is alert and oriented to person, place, and time.  Psychiatric:        Mood and Affect: Mood normal.        Behavior: Behavior normal.        Thought Content: Thought content normal.        Judgment: Judgment normal.   A: 38 year old [id number] with improved symptoms, postpartum anxiety resolved.  P: Encouraged close follow up if anxiety increases again.  Otherwise, schedule regular 6 week postpartum visit.  Avel Sensor, CNM 09/21/2018

## 2018-10-17 ENCOUNTER — Telehealth: Payer: Self-pay

## 2018-10-17 ENCOUNTER — Ambulatory Visit (INDEPENDENT_AMBULATORY_CARE_PROVIDER_SITE_OTHER): Payer: Medicaid Other

## 2018-10-17 ENCOUNTER — Other Ambulatory Visit: Payer: Self-pay | Admitting: Obstetrics & Gynecology

## 2018-10-17 ENCOUNTER — Encounter: Payer: Self-pay | Admitting: Obstetrics & Gynecology

## 2018-10-17 ENCOUNTER — Ambulatory Visit (INDEPENDENT_AMBULATORY_CARE_PROVIDER_SITE_OTHER): Payer: Medicaid Other | Admitting: Obstetrics & Gynecology

## 2018-10-17 DIAGNOSIS — N8301 Follicular cyst of right ovary: Secondary | ICD-10-CM | POA: Diagnosis not present

## 2018-10-17 MED ORDER — MEDROXYPROGESTERONE ACETATE 10 MG PO TABS: 10.0000 mg | ORAL_TABLET | Freq: Every day | ORAL | 0 refills | Status: DC

## 2018-10-17 NOTE — Telephone Encounter (Signed)
Pt delivered 09/09/18.  Pt is bleeding heavily (see prev msg) and is now in a lot of pain.  [phone]

## 2018-10-17 NOTE — Progress Notes (Signed)
HPI:      [name] is a 38 y.o. [phone] who LMP was No LMP recorded., presents today for a problem visit.  She complains of menometrorrhagia that  began 2 weeks ago, she had had some let up in post partum lochia but then it intensified and is still bleeding heavy and w cramps and pain;  she has had prior retained placenta requiring D&C after first pregnancy.   Previous evaluation: none. Prior Diagnosis: as above.  Usually has reg cycles when not pregnant.. Previous Treatment: none.  She is not sexually active yet.  Condoms planned for [facility].  PMHx: She  has a past medical history of Hay fever, History of HPV infection, History of UTI, and Retained placenta. Also,  has a past surgical history that includes LEEP (2006) and Dilation and curettage of uterus (N/A, 12/17/2015)., family history includes Alcohol abuse in her father and another family member; Anemia in her mother; Aplastic anemia in her father; Heart disease in her father; Hypertension in her father; Stroke in her father.,  reports that she has quit smoking. She has never used smokeless tobacco. She reports current alcohol use. She reports that she does not use drugs.  She  Current Outpatient Medications:  .  ALPRAZolam (XANAX) 0.25 MG tablet, Take 1 tablet (0.25 mg total) by mouth at bedtime as needed for anxiety., Disp: 30 tablet, Rfl: 0 .  ferrous sulfate 325 (65 FE) MG tablet, Take 325 mg by mouth daily with breakfast., Disp: , Rfl:  .  Prenatal Vit-Fe Fumarate-FA (PREPLUS) 27-1 MG TABS, Take 1 tablet by mouth daily., Disp: , Rfl: 2 .  sertraline (ZOLOFT) 50 MG tablet, Take 1 tablet by mouth daily. Reported on 01/13/2016, Disp: , Rfl:  .  vitamin B-12 (CYANOCOBALAMIN) 500 MCG tablet, Take 500 mcg by mouth daily., Disp: , Rfl:  .  medroxyPROGESTERone (PROVERA) 10 MG tablet, Take 1 tablet (10 mg total) by mouth daily for 10 days., Disp: 10 tablet, Rfl: 0  Also, has No Known Allergies.  Review of Systems  Constitutional: Negative  for chills, fever and malaise/fatigue.  HENT: Negative for congestion, sinus pain and sore throat.   Eyes: Negative for blurred vision and pain.  Respiratory: Negative for cough and wheezing.   Cardiovascular: Negative for chest pain and leg swelling.  Gastrointestinal: Negative for abdominal pain, constipation, diarrhea, heartburn, nausea and vomiting.  Genitourinary: Negative for dysuria, frequency, hematuria and urgency.  Musculoskeletal: Negative for back pain, joint pain, myalgias and neck pain.  Skin: Negative for itching and rash.  Neurological: Negative for dizziness, tremors and weakness.  Endo/Heme/Allergies: Does not bruise/bleed easily.  Psychiatric/Behavioral: Negative for depression. The patient is not nervous/anxious and does not have insomnia.    Objective: BP 130/80   Ht 5\' 4"  (1.626 m)   Wt 202 lb (91.6 kg)   BMI 34.67 kg/m  Physical Exam Constitutional:      General: She is not in acute distress.    Appearance: She is well-developed.  Musculoskeletal: Normal range of motion.  Neurological:     Mental Status: She is alert and oriented to person, place, and time.  Skin:    General: Skin is warm and dry.  Vitals signs reviewed.   ASSESSMENT/PLAN:  dysfunctional uterine bleeding and / or postpartum delayed hemorrhage;  will evaluate for retained placenta or other anatomical etiology; Provera for hormonal control  A total of 15 minutes were spent face-to-face with the patient during this encounter and over half of that  time dealt with counseling and coordination of care.  Barnett Applebaum, MD, [facility], [facility] 10/17/2018  3:24 PM  Addendum: Review of ULTRASOUND.    I have personally reviewed images and report of recent ultrasound done at [facility].    Plan of management to be discussed with patient.    No evidence for retained products/ placenta  Provera trial.   Fiollow up.  Barnett Applebaum, MD, [facility], [facility] 10/17/2018  4:07 PM

## 2018-10-17 NOTE — Telephone Encounter (Signed)
Never ED.  I will see her

## 2018-10-17 NOTE — Patient Instructions (Signed)
Medroxyprogesterone tablets What is this medicine? MEDROXYPROGESTERONE (me DROX ee proe JES te rone) is a hormone in a class called progestins. It is commonly used to prevent the uterine lining from overgrowth in women taking an estrogen after menopause. It is also used to treat irregular menstrual bleeding or a lack of menstrual bleeding in women. This medicine may be used for other purposes; ask your health care provider or pharmacist if you have questions. COMMON BRAND NAME(S): Amen, Provera What should I tell my health care provider before I take this medicine? They need to know if you have any of these conditions: -blood vessel disease or a history of a blood clot in the lungs or legs -breast, cervical or vaginal cancer -heart disease -kidney disease -liver disease -migraine -recent miscarriage or abortion -mental depression -migraine -seizures (convulsions) -stroke -vaginal bleeding that has not been evaluated -an unusual or allergic reaction to medroxyprogesterone, other medicines, foods, dyes, or preservatives -pregnant or trying to get pregnant -breast-feeding How should I use this medicine? Take this medicine by mouth with a glass of water. Follow the directions on the prescription label. Take your doses at regular intervals. Do not take your medicine more often than directed. Talk to your pediatrician regarding the use of this medicine in children. Special care may be needed. While this drug may be prescribed for children as young as 13 years for selected conditions, precautions do apply. Overdosage: If you think you have taken too much of this medicine contact a poison control center or emergency room at once. NOTE: This medicine is only for you. Do not share this medicine with others. What if I miss a dose? If you miss a dose, take it as soon as you can. If it is almost time for your next dose, take only that dose. Do not take double or extra doses. What may interact with  this medicine? -barbiturate medicines for inducing sleep or treating seizures (convulsions) -bosentan -carbamazepine -phenytoin -rifampin -St. John's Wort This list may not describe all possible interactions. Give your health care provider a list of all the medicines, herbs, non-prescription drugs, or dietary supplements you use. Also tell them if you smoke, drink alcohol, or use illegal drugs. Some items may interact with your medicine. What should I watch for while using this medicine? Visit your health care professional for regular checks on your progress. You will need a regular breast and pelvic exam. If you have any reason to think you are pregnant, stop taking this medicine at once and contact your doctor or health care professional. What side effects may I notice from receiving this medicine? Side effects that you should report to your doctor or health care professional as soon as possible: -breast tenderness or discharge -changes in mood or emotions, such as depression -changes in vision or speech -pain in the abdomen, chest, groin, or leg -severe headache -skin rash, itching, or hives -sudden shortness of breath -unusually weak or tired -yellowing of skin or eyes Side effects that usually do not require medical attention (report to your doctor or health care professional if they continue or are bothersome): -acne -change in menstrual bleeding pattern or flow -changes in sexual desire -facial hair growth -fluid retention and swelling -headache -upset stomach -weight gain or loss This list may not describe all possible side effects. Call your doctor for medical advice about side effects. You may report side effects to FDA at 1-800-FDA-1088. Where should I keep my medicine? Keep out of the reach of children.  Store at room temperature between 20 and 25 degrees C (68 and 77 degrees F). Throw away any unused medicine after the expiration date. NOTE: This sheet is a summary. It  may not cover all possible information. If you have questions about this medicine, talk to your doctor, pharmacist, or health care provider.  2019 Elsevier/Gold Standard (2008-08-23 11:26:12)  

## 2018-10-17 NOTE — Telephone Encounter (Signed)
Pt called after hour nurse 10/15/18 at 12:27pm c/o being 5wks pp and having increased vaginal bleeding; going thru 2 pads a day.  [phone]  Adv pt per our policy that as long as she doesn't feel weak or dizzy, saturate a pad q41min-1hr it's normal and she is okay.  This is probably her first period after delivery and they are usually heavier than normal. Pt reassured.

## 2018-10-20 ENCOUNTER — Encounter: Payer: Self-pay | Admitting: Obstetrics & Gynecology

## 2018-10-20 ENCOUNTER — Other Ambulatory Visit (HOSPITAL_COMMUNITY)
Admission: RE | Admit: 2018-10-20 | Discharge: 2018-10-20 | Disposition: A | Discharge status: Home / Self Care | Payer: Medicaid Other | Source: Ambulatory Visit | Attending: Obstetrics & Gynecology | Admitting: Obstetrics & Gynecology

## 2018-10-20 ENCOUNTER — Ambulatory Visit (INDEPENDENT_AMBULATORY_CARE_PROVIDER_SITE_OTHER): Payer: Medicaid Other | Admitting: Obstetrics & Gynecology

## 2018-10-20 ENCOUNTER — Ambulatory Visit: Payer: Medicaid Other | Admitting: Advanced Practice Midwife

## 2018-10-20 DIAGNOSIS — Z1389 Encounter for screening for other disorder: Secondary | ICD-10-CM | POA: Diagnosis not present

## 2018-10-20 DIAGNOSIS — Z9289 Personal history of other medical treatment: Secondary | ICD-10-CM

## 2018-10-20 DIAGNOSIS — Z124 Encounter for screening for malignant neoplasm of cervix: Secondary | ICD-10-CM | POA: Diagnosis present

## 2018-10-20 NOTE — Telephone Encounter (Signed)
This encounter was created in error - please disregard.

## 2018-10-20 NOTE — Progress Notes (Signed)
  OBSTETRICS POSTPARTUM CLINIC PROGRESS NOTE  Subjective:     Julia Gay is a 38 y.o. [phone] female who presents for a postpartum visit. She is 6 weeks postpartum following a Term pregnancy and delivery by Vaginal, no problems at delivery.  I have fully reviewed the prenatal and intrapartum course. Anesthesia: epidural.  Postpartum course has been complicated by uncomplicated.  Baby is feeding by Bottle.  Bleeding: patient has  resumed menses.  Bowel function is normal. Bladder function is normal.  Patient is not sexually active. Contraception method desired is condoms.  Postpartum depression screening: negative. [place] 5.  The following portions of the patient's history were reviewed and updated as appropriate: allergies, current medications, past family history, past medical history, past social history, past surgical history and problem list.  Review of Systems Pertinent items are noted in HPI.  Objective:    BP 120/80   Ht 5\' 4"  (1.626 m)   Wt 202 lb (91.6 kg)   BMI 34.67 kg/m   General:  alert and no distress   Breasts:  inspection negative, no nipple discharge or bleeding, no masses or nodularity palpable  Lungs: clear to auscultation bilaterally  Heart:  regular rate and rhythm, S1, S2 normal, no murmur, click, rub or gallop  Abdomen: soft, non-tender; bowel sounds normal; no masses,  no organomegaly.     Vulva:  normal  Vagina: normal vagina, no discharge, exudate, lesion, or erythema  Cervix:  no cervical motion tenderness and no lesions  Corpus: normal size, contour, position, consistency, mobility, non-tender  Adnexa:  normal adnexa and no mass, fullness, tenderness  Rectal Exam: Not performed.          Assessment:  Post Partum Care visit 1. Encounter for routine postpartum follow-up  2. Screening for cervical cancer - Cytology - PAP  Plan:  See orders and Patient Instructions Contraceptive counseling for condoms Resume all normal activities Follow  up in: 12 months or as needed.   RPR lab as false pos test found in hospital, desires reassurance  Barnett Applebaum, MD, [facility], [facility] 10/20/2018  3:35 PM

## 2018-10-21 LAB — RPR: RPR Ser Ql: NONREACTIVE

## 2018-10-25 LAB — CYTOLOGY - PAP
Diagnosis: NEGATIVE
HPV: NOT DETECTED

## 2018-10-26 ENCOUNTER — Telehealth: Payer: Self-pay

## 2018-10-26 NOTE — Telephone Encounter (Signed)
Pt calling; was given med to stop bleeding which starts and stops.  Tomorrow is her last pill.  The bleeding isn't heavy but she can feel it coming out.  The bleeding hasn't really stopped since she delivered 1/6.  Is this normal?  [phone]

## 2018-10-27 NOTE — Telephone Encounter (Signed)
Pt states she will give it some more time

## 2018-10-27 NOTE — Telephone Encounter (Signed)
I would recommend switching to estrogen type hormone therapy for another time period, esp as [place] was normal.  Can eRx that if desired; other option is to wait and see

## 2018-10-28 NOTE — Telephone Encounter (Signed)
Pt calling; bleeding has picked up today.  Pt would like to try medication discussed yesterday.  [phone]

## 2018-10-31 ENCOUNTER — Other Ambulatory Visit: Payer: Self-pay | Admitting: Obstetrics & Gynecology

## 2018-10-31 MED ORDER — NORGESTIMATE-ETH ESTRADIOL 0.25-35 MG-MCG PO TABS: ORAL_TABLET | ORAL | 11 refills | Status: DC

## 2018-10-31 NOTE — Telephone Encounter (Signed)
Rx done for pill pack, 2 pills daily for 3 days then one pill daily to finish pack

## 2019-01-17 ENCOUNTER — Telehealth: Payer: Self-pay

## 2019-01-17 NOTE — Telephone Encounter (Signed)
Pt called triage stating she is experiencing irregular bleeding ever sense the birth of her child and would like to know if she needs to be seen or if she can just talk to the provider over the phone for this issue. Please advise thank you!

## 2019-01-18 NOTE — Telephone Encounter (Signed)
Called and left voice mail for patient to call back to be schedule °

## 2019-01-23 ENCOUNTER — Other Ambulatory Visit: Payer: Self-pay

## 2019-01-23 ENCOUNTER — Ambulatory Visit: Payer: Medicaid Other | Admitting: Obstetrics & Gynecology

## 2019-11-11 ENCOUNTER — Ambulatory Visit: Payer: Medicaid Other | Attending: Internal Medicine

## 2019-11-11 DIAGNOSIS — Z23 Encounter for immunization: Secondary | ICD-10-CM | POA: Insufficient documentation

## 2019-11-11 NOTE — Progress Notes (Signed)
   Covid-19 Vaccination Clinic  Name:  Julia Gay    MRN: [id number] DOB: [birth year]  11/11/2019  Ms. Cousin was observed post Covid-19 immunization for 15 minutes without incident. She was provided with Vaccine Information Sheet and instruction to access the V-Safe system.   [name] was instructed to call 911 with any severe reactions post vaccine: Marland Kitchen Difficulty breathing  . Swelling of face and throat  . A fast heartbeat  . A bad rash all over body  . Dizziness and weakness   Immunizations Administered    Name Date Dose VIS Date Route   Pfizer COVID-19 Vaccine 11/11/2019 12:16 AM 0.3 mL 08/18/2019 Intramuscular   Manufacturer: [place]   Lot: [id number]   Bernie: [id number]

## 2019-11-25 ENCOUNTER — Emergency Department
Admission: EM | Admit: 2019-11-25 | Discharge: 2019-11-26 | Disposition: A | Discharge status: Home / Self Care | Payer: Medicaid Other | Attending: Emergency Medicine | Admitting: Emergency Medicine

## 2019-11-25 ENCOUNTER — Other Ambulatory Visit: Payer: Self-pay

## 2019-11-25 ENCOUNTER — Encounter: Payer: Self-pay | Admitting: Emergency Medicine

## 2019-11-25 DIAGNOSIS — Z87891 Personal history of nicotine dependence: Secondary | ICD-10-CM | POA: Diagnosis not present

## 2019-11-25 DIAGNOSIS — L03116 Cellulitis of left lower limb: Secondary | ICD-10-CM

## 2019-11-25 DIAGNOSIS — Z79899 Other long term (current) drug therapy: Secondary | ICD-10-CM | POA: Diagnosis not present

## 2019-11-25 DIAGNOSIS — W57XXXA Bitten or stung by nonvenomous insect and other nonvenomous arthropods, initial encounter: Secondary | ICD-10-CM | POA: Diagnosis not present

## 2019-11-25 DIAGNOSIS — S70362A Insect bite (nonvenomous), left thigh, initial encounter: Secondary | ICD-10-CM | POA: Diagnosis present

## 2019-11-25 LAB — CBC WITH DIFFERENTIAL/PLATELET
Abs Immature Granulocytes: 0.03 10*3/uL (ref 0.00–0.07)
Basophils Absolute: 0 10*3/uL (ref 0.0–0.1)
Basophils Relative: 0 %
Eosinophils Absolute: 0.4 10*3/uL (ref 0.0–0.5)
Eosinophils Relative: 4 %
HCT: 41.6 % (ref 36.0–46.0)
Hemoglobin: 13.5 g/dL (ref 12.0–15.0)
Immature Granulocytes: 0 %
Lymphocytes Relative: 23 %
Lymphs Abs: 2.5 10*3/uL (ref 0.7–4.0)
MCH: 27.6 pg (ref 26.0–34.0)
MCHC: 32.5 g/dL (ref 30.0–36.0)
MCV: 84.9 fL (ref 80.0–100.0)
Monocytes Absolute: 0.6 10*3/uL (ref 0.1–1.0)
Monocytes Relative: 5 %
Neutro Abs: 7.3 10*3/uL (ref 1.7–7.7)
Neutrophils Relative %: 68 %
Platelets: 302 10*3/uL (ref 150–400)
RBC: 4.9 MIL/uL (ref 3.87–5.11)
RDW: 12.8 % (ref 11.5–15.5)
WBC: 10.8 10*3/uL — ABNORMAL HIGH (ref 4.0–10.5)
nRBC: 0 % (ref 0.0–0.2)

## 2019-11-25 LAB — COMPREHENSIVE METABOLIC PANEL
ALT: 23 U/L (ref 0–44)
AST: 29 U/L (ref 15–41)
Albumin: 5 g/dL (ref 3.5–5.0)
Alkaline Phosphatase: 62 U/L (ref 38–126)
Anion gap: 9 (ref 5–15)
BUN: 11 mg/dL (ref 6–20)
CO2: 23 mmol/L (ref 22–32)
Calcium: 9.7 mg/dL (ref 8.9–10.3)
Chloride: 104 mmol/L (ref 98–111)
Creatinine, Ser: 0.75 mg/dL (ref 0.44–1.00)
GFR calc Af Amer: >60 mL/min (ref >60)
GFR calc non Af Amer: >60 mL/min (ref >60)
Glucose, Bld: 103 mg/dL — ABNORMAL HIGH (ref 70–99)
Potassium: 3.6 mmol/L (ref 3.5–5.1)
Sodium: 136 mmol/L (ref 135–145)
Total Bilirubin: 0.4 mg/dL (ref 0.3–1.2)
Total Protein: 8.5 g/dL — ABNORMAL HIGH (ref 6.5–8.1)

## 2019-11-25 NOTE — ED Triage Notes (Signed)
Patient states that she has a spider bite to her left outer thigh. Patient states that she was seen at urgent care and Thursday and started on antibiotics. Patient states that the area was getting worse so she saw her PCP on Friday. Patient's antibiotics were changed. Patient states that now the area is becoming larger and now with swelling. Patient with large area of redness to left outer thigh. Patient's leg was marked by PCP yesterday and the area of redness has spread outside of the line.

## 2019-11-26 ENCOUNTER — Other Ambulatory Visit: Payer: Self-pay

## 2019-11-26 DIAGNOSIS — L03116 Cellulitis of left lower limb: Secondary | ICD-10-CM | POA: Insufficient documentation

## 2019-11-26 DIAGNOSIS — Z87891 Personal history of nicotine dependence: Secondary | ICD-10-CM | POA: Insufficient documentation

## 2019-11-26 DIAGNOSIS — Z5189 Encounter for other specified aftercare: Secondary | ICD-10-CM | POA: Insufficient documentation

## 2019-11-26 MED ORDER — SODIUM CHLORIDE 0.9 % IV SOLN
1.0000 g | Freq: Once | INTRAVENOUS | Status: AC
  Administered 2019-11-26: 1 g via INTRAVENOUS
  Filled 2019-11-26: qty 10

## 2019-11-26 MED ORDER — CEPHALEXIN 500 MG PO CAPS: 500.0000 mg | ORAL_CAPSULE | Freq: Three times a day (TID) | ORAL | 0 refills | Status: AC

## 2019-11-26 NOTE — ED Triage Notes (Signed)
Patient reports being seen in this ED last night by MD Owens Shark for possible brown recluse bite. Area was marked by staff, redness has not gone past marked area. Patient reports decrease in swelling. There is still significant swelling and redness to area. Patient reports having labs and IV antibiotics last night. Patient reports she was told to come back by MD for recheck today to see if more IV antibiotics were needed.

## 2019-11-26 NOTE — ED Provider Notes (Signed)
[facility] Emergency Department Provider Note  ____________________________________________   First MD Initiated Contact with Patient 11/25/19 2353     (approximate)  I have reviewed the triage vital signs and the nursing notes.   HISTORY  Chief Complaint Cellulitis and Insect Bite    HPI Julia Gay is a 39 y.o. female with below list of previous medical conditions presents to the emergency department secondary to infected spider bite to her left lateral thigh.  Patient states that incident occurred on Monday and she was seen at urgent care and initially started on Bactrim with worsening of symptoms and as such she saw her PCP on Friday who changed her from Bactrim to clindamycin.  Patient states that area has had increased redness swelling and discomfort.  Patient denies any fever afebrile on presentation.       Past Medical History:  Diagnosis Date  . Hay fever   . History of HPV infection   . History of UTI   . Retained placenta     Patient Active Problem List   Diagnosis Date Noted  . Pregnancy 09/09/2018  . Prolonged rupture of membranes 09/09/2018  . Postpartum care following vaginal delivery 09/09/2018  . History of preterm delivery, currently pregnant, unspecified trimester 04/08/2018  . Supervision of high risk pregnancy, antepartum, first trimester 02/14/2018  . History of pre-eclampsia in prior pregnancy, currently pregnant in first trimester 02/14/2018  . Vitamin D deficiency 10/10/2015  . AMA (advanced maternal age) multigravida 27+, first trimester 06/13/2015  . Preventative health care 05/21/2015  . Obsessive compulsive disorder 04/17/2015    Past Surgical History:  Procedure Laterality Date  . DILATION AND CURETTAGE OF UTERUS N/A 12/17/2015   Procedure: DILATATION AND CURETTAGE;  Surgeon: Brayton Mars, MD;  Location: [facility] ORS;  Service: Gynecology;  Laterality: N/A;  . LEEP  2006    Prior to Admission medications    Medication Sig Start Date End Date Taking? Authorizing Provider  ALPRAZolam (XANAX) 0.25 MG tablet Take 1 tablet (0.25 mg total) by mouth at bedtime as needed for anxiety. 09/13/18   Rexene Agent, CNM  ferrous sulfate 325 (65 FE) MG tablet Take 325 mg by mouth daily with breakfast.    [provider]  norgestimate-ethinyl estradiol (ORTHO-CYCLEN,SPRINTEC,PREVIFEM) 0.25-35 MG-MCG tablet Take 2 pills po daily for 3 days then one pill po daily for remainder of pack 10/31/18   Gae Dry, MD  Prenatal Vit-Fe Fumarate-FA (PREPLUS) 27-1 MG TABS Take 1 tablet by mouth daily. 01/25/18   [provider]  sertraline (ZOLOFT) 50 MG tablet Take 1 tablet by mouth daily. Reported on 01/13/2016    [provider]  vitamin B-12 (CYANOCOBALAMIN) 500 MCG tablet Take 500 mcg by mouth daily.    [provider]    Allergies Patient has no known allergies.  Family History  Problem Relation Age of Onset  . Alcohol abuse Father        quit when pt was a child  . Heart disease Father   . Stroke Father   . Hypertension Father   . Aplastic anemia Father   . Alcohol abuse Other        both grandparents  . Anemia Mother     Social History Social History   Tobacco Use  . Smoking status: Former Research scientist (life sciences)  . Smokeless tobacco: Never Used  . Tobacco comment: quit in 2004  Substance Use Topics  . Alcohol use: Yes    Alcohol/week: 0.0 standard  drinks    Comment: socially  . Drug use: No    Review of Systems Constitutional: No fever/chills Eyes: No visual changes. ENT: No sore throat. Cardiovascular: Denies chest pain. Respiratory: Denies shortness of breath. Gastrointestinal: No abdominal pain.  No nausea, no vomiting.  No diarrhea.  No constipation. Genitourinary: Negative for dysuria. Musculoskeletal: Negative for neck pain.  Negative for back pain. Integumentary: Positive for infected spider bite to the left thigh Neurological: Negative for headaches, focal  weakness or numbness.   ____________________________________________   PHYSICAL EXAM:  VITAL SIGNS: ED Triage Vitals [11/25/19 2133]  Enc Vitals Group     BP (!) 138/92     Pulse Rate (!) 117     Resp 18     Temp 97.6 F (36.4 C)     Temp Source Oral     SpO2 98 %     Weight 92.1 kg (203 lb)     Height 1.626 m (5\' 4" )     Head Circumference      Peak Flow      Pain Score 0     Pain Loc      Pain Edu?      Excl. in [place]?     Constitutional: Alert and oriented.  Eyes: Conjunctivae are normal.  Mouth/Throat: Patient is wearing a mask. Neck: No stridor.  No meningeal signs.   Cardiovascular: Normal rate, regular rhythm. Good peripheral circulation. Grossly normal heart sounds. Respiratory: Normal respiratory effort.  No retractions. Gastrointestinal: Soft and nontender. No distention.  Musculoskeletal: No lower extremity tenderness nor edema. No gross deformities of extremities. Neurologic:  Normal speech and language. No gross focal neurologic deficits are appreciated.  Skin:     Psychiatric: Mood and affect are normal. Speech and behavior are normal.  ____________________________________________   LABS (all labs ordered are listed, but only abnormal results are displayed)  Labs Reviewed  COMPREHENSIVE METABOLIC PANEL - Abnormal; Notable for the following components:      Result Value   Glucose, Bld 103 (*)    Total Protein 8.5 (*)    All other components within normal limits  CBC WITH DIFFERENTIAL/PLATELET - Abnormal; Notable for the following components:   WBC 10.8 (*)    All other components within normal limits   ____________________________________    Procedures   ____________________________________________   INITIAL IMPRESSION / MDM / ASSESSMENT AND PLAN / ED COURSE  As part of my medical decision making, I reviewed the following data within the electronic MEDICAL RECORD NUMBER  39 year old female presented with above-stated history and physical  exam secondary to cellulitis to the left lateral thigh most likely secondary to Jernee Murtaugh recluse spider bite.  No area of necrosis noted at this time.  Patient given 1 g IV ceftriaxone in the emergency department will be prescribed Keflex for home.  Patient advised to return in 24 hours for wound exam.  ____________________________________________  FINAL CLINICAL IMPRESSION(S) / ED DIAGNOSES  Final diagnoses:  Cellulitis of left lower extremity     MEDICATIONS GIVEN DURING THIS VISIT:  Medications  cefTRIAXone (ROCEPHIN) 1 g in sodium chloride 0.9 % 100 mL IVPB (1 g Intravenous New Bag/Given 11/26/19 0039)     ED Discharge Orders    None      *Please note:  Julia Gay was evaluated in Emergency Department on 11/26/2019 for the symptoms described in the history of present illness. She was evaluated in the context of the global COVID-19 pandemic, which necessitated consideration that the patient  might be at risk for infection with the SARS-CoV-2 virus that causes COVID-19. Institutional protocols and algorithms that pertain to the evaluation of patients at risk for COVID-19 are in a state of rapid change based on information released by regulatory bodies including the CDC and federal and state organizations. These policies and algorithms were followed during the patient's care in the ED.  Some ED evaluations and interventions may be delayed as a result of limited staffing during the pandemic.*  Note:  This document was prepared using Dragon voice recognition software and may include unintentional dictation errors.   Gregor Hams, MD 11/26/19 0200

## 2019-11-27 ENCOUNTER — Emergency Department
Admission: EM | Admit: 2019-11-27 | Discharge: 2019-11-27 | Disposition: A | Discharge status: Home / Self Care | Payer: Medicaid Other | Source: Home / Self Care | Attending: Emergency Medicine | Admitting: Emergency Medicine

## 2019-11-27 DIAGNOSIS — Z5189 Encounter for other specified aftercare: Secondary | ICD-10-CM

## 2019-11-27 NOTE — ED Provider Notes (Signed)
[facility] Emergency Department Provider Note  ____________________________________________   First MD Initiated Contact with Patient 11/27/19 0034     (approximate)  I have reviewed the triage vital signs and the nursing notes.   HISTORY  Chief Complaint Wound Check   HPI Julia Gay is a 39 y.o. female with below list of previous medical conditions including recent visit yesterday secondary to left thigh cellulitis returns to the emergency department secondary for wound exam.  Patient states that swelling, pruritus and redness has improved since evaluation yesterday.  Patient denies any fever afebrile on presentation temperature 97.8.        Past Medical History:  Diagnosis Date  . Hay fever   . History of HPV infection   . History of UTI   . Retained placenta     Patient Active Problem List   Diagnosis Date Noted  . Pregnancy 09/09/2018  . Prolonged rupture of membranes 09/09/2018  . Postpartum care following vaginal delivery 09/09/2018  . History of preterm delivery, currently pregnant, unspecified trimester 04/08/2018  . Supervision of high risk pregnancy, antepartum, first trimester 02/14/2018  . History of pre-eclampsia in prior pregnancy, currently pregnant in first trimester 02/14/2018  . Vitamin D deficiency 10/10/2015  . AMA (advanced maternal age) multigravida 45+, first trimester 06/13/2015  . Preventative health care 05/21/2015  . Obsessive compulsive disorder 04/17/2015    Past Surgical History:  Procedure Laterality Date  . DILATION AND CURETTAGE OF UTERUS N/A 12/17/2015   Procedure: DILATATION AND CURETTAGE;  Surgeon: Brayton Mars, MD;  Location: [facility] ORS;  Service: Gynecology;  Laterality: N/A;  . LEEP  2006    Prior to Admission medications   Medication Sig Start Date End Date Taking? Authorizing Provider  ALPRAZolam (XANAX) 0.25 MG tablet Take 1 tablet (0.25 mg total) by mouth at bedtime as needed for  anxiety. 09/13/18   Rexene Agent, CNM  cephALEXin (KEFLEX) 500 MG capsule Take 1 capsule (500 mg total) by mouth 3 (three) times daily for 10 days. 11/26/19 12/06/19  Gregor Hams, MD  ferrous sulfate 325 (65 FE) MG tablet Take 325 mg by mouth daily with breakfast.    [provider]  norgestimate-ethinyl estradiol (ORTHO-CYCLEN,SPRINTEC,PREVIFEM) 0.25-35 MG-MCG tablet Take 2 pills po daily for 3 days then one pill po daily for remainder of pack 10/31/18   Gae Dry, MD  Prenatal Vit-Fe Fumarate-FA (PREPLUS) 27-1 MG TABS Take 1 tablet by mouth daily. 01/25/18   [provider]  sertraline (ZOLOFT) 50 MG tablet Take 1 tablet by mouth daily. Reported on 01/13/2016    [provider]  vitamin B-12 (CYANOCOBALAMIN) 500 MCG tablet Take 500 mcg by mouth daily.    [provider]    Allergies Patient has no known allergies.  Family History  Problem Relation Age of Onset  . Alcohol abuse Father        quit when pt was a child  . Heart disease Father   . Stroke Father   . Hypertension Father   . Aplastic anemia Father   . Alcohol abuse Other        both grandparents  . Anemia Mother     Social History Social History   Tobacco Use  . Smoking status: Former Research scientist (life sciences)  . Smokeless tobacco: Never Used  . Tobacco comment: quit in 2004  Substance Use Topics  . Alcohol use: Yes    Alcohol/week: 0.0 standard drinks    Comment: socially  .  Drug use: No    Review of Systems Constitutional: No fever/chills Eyes: No visual changes. ENT: No sore throat. Cardiovascular: Denies chest pain. Respiratory: Denies shortness of breath. Gastrointestinal: No abdominal pain.  No nausea, no vomiting.  No diarrhea.  No constipation. Genitourinary: Negative for dysuria. Musculoskeletal: Negative for neck pain.  Negative for back pain. Integumentary: Negative for rash.  Positive for left thigh cellulitis Neurological: Negative for headaches, focal weakness or  numbness.  ____________________________________________   PHYSICAL EXAM:  VITAL SIGNS: ED Triage Vitals  Enc Vitals Group     BP 11/26/19 2136 131/84     Pulse Rate 11/26/19 2136 78     Resp 11/26/19 2136 18     Temp 11/26/19 2136 97.8 F (36.6 C)     Temp src --      SpO2 11/26/19 2136 99 %     Weight 11/26/19 2134 92.1 kg (203 lb)     Height 11/26/19 2134 1.626 m (5\' 4" )     Head Circumference --      Peak Flow --      Pain Score 11/26/19 2134 3     Pain Loc --      Pain Edu? --      Excl. in [place]? --     Constitutional: Alert and oriented.  Mouth/Throat: Patient is wearing a mask. Neck: No stridor.  No meningeal signs.   Cardiovascular: Normal rate, regular rhythm. Good peripheral circulation. Grossly normal heart sounds. Respiratory: Normal respiratory effort.  No retractions. Musculoskeletal: No lower extremity tenderness nor edema. No gross deformities of extremities. Neurologic:  Normal speech and language. No gross focal neurologic deficits are appreciated.  Skin:     Psychiatric: Mood and affect are normal. Speech and behavior are normal.   Procedures   ____________________________________________   INITIAL IMPRESSION / MDM / ASSESSMENT AND PLAN / ED COURSE  As part of my medical decision making, I reviewed the following data within the electronic MEDICAL RECORD NUMBER    39 year old female presented with above-stated history and physical exam secondary to revisit for wound evaluation secondary to cellulitis presumed to be secondary to a spider bite.  Area looks improved in comparison to yesterday please refer to above picture in the picture from the chart from the day before.  We will continue management.  Patient advised of warning signs I would warrant immediate evaluation  ____________________________________________  FINAL CLINICAL IMPRESSION(S) / ED DIAGNOSES  Final diagnoses:  Encounter for wound re-check     MEDICATIONS GIVEN DURING THIS  VISIT:  Medications - No data to display   ED Discharge Orders    None      *Please note:  Julia Gay was evaluated in Emergency Department on 11/27/2019 for the symptoms described in the history of present illness. She was evaluated in the context of the global COVID-19 pandemic, which necessitated consideration that the patient might be at risk for infection with the SARS-CoV-2 virus that causes COVID-19. Institutional protocols and algorithms that pertain to the evaluation of patients at risk for COVID-19 are in a state of rapid change based on information released by regulatory bodies including the CDC and federal and state organizations. These policies and algorithms were followed during the patient's care in the ED.  Some ED evaluations and interventions may be delayed as a result of limited staffing during the pandemic.*  Note:  This document was prepared using Dragon voice recognition software and may include unintentional dictation errors.   Gregor Hams,  MD 11/27/19 [id number]

## 2019-12-02 ENCOUNTER — Ambulatory Visit: Payer: Medicaid Other

## 2020-04-25 ENCOUNTER — Other Ambulatory Visit: Payer: Self-pay | Admitting: Family Medicine

## 2020-04-25 DIAGNOSIS — Z1231 Encounter for screening mammogram for malignant neoplasm of breast: Secondary | ICD-10-CM

## 2020-05-28 ENCOUNTER — Ambulatory Visit
Admission: RE | Admit: 2020-05-28 | Discharge: 2020-05-28 | Disposition: A | Discharge status: Home / Self Care | Payer: Medicaid Other | Source: Ambulatory Visit | Attending: Family Medicine | Admitting: Family Medicine

## 2020-05-28 ENCOUNTER — Other Ambulatory Visit: Payer: Self-pay

## 2020-05-28 DIAGNOSIS — Z1231 Encounter for screening mammogram for malignant neoplasm of breast: Secondary | ICD-10-CM | POA: Insufficient documentation

## 2020-06-04 ENCOUNTER — Other Ambulatory Visit: Payer: Self-pay | Admitting: Family Medicine

## 2020-06-04 DIAGNOSIS — N6310 Unspecified lump in the right breast, unspecified quadrant: Secondary | ICD-10-CM

## 2020-06-04 DIAGNOSIS — R928 Other abnormal and inconclusive findings on diagnostic imaging of breast: Secondary | ICD-10-CM

## 2020-06-05 ENCOUNTER — Other Ambulatory Visit: Payer: Self-pay

## 2020-06-05 ENCOUNTER — Ambulatory Visit
Admission: RE | Admit: 2020-06-05 | Discharge: 2020-06-05 | Disposition: A | Discharge status: Home / Self Care | Payer: Medicaid Other | Source: Ambulatory Visit | Attending: Family Medicine | Admitting: Family Medicine

## 2020-06-05 DIAGNOSIS — N6311 Unspecified lump in the right breast, upper outer quadrant: Secondary | ICD-10-CM | POA: Diagnosis not present

## 2020-06-05 DIAGNOSIS — N6310 Unspecified lump in the right breast, unspecified quadrant: Secondary | ICD-10-CM | POA: Diagnosis present

## 2020-06-05 DIAGNOSIS — R928 Other abnormal and inconclusive findings on diagnostic imaging of breast: Secondary | ICD-10-CM | POA: Insufficient documentation

## 2020-06-06 ENCOUNTER — Other Ambulatory Visit: Payer: Self-pay | Admitting: Family Medicine

## 2020-06-06 DIAGNOSIS — N6310 Unspecified lump in the right breast, unspecified quadrant: Secondary | ICD-10-CM

## 2020-06-13 ENCOUNTER — Other Ambulatory Visit: Payer: Medicaid Other

## 2020-06-13 ENCOUNTER — Ambulatory Visit: Payer: Medicaid Other

## 2020-12-04 ENCOUNTER — Ambulatory Visit
Admission: RE | Admit: 2020-12-04 | Discharge: 2020-12-04 | Disposition: A | Discharge status: Home / Self Care | Payer: Medicaid Other | Source: Ambulatory Visit | Attending: Family Medicine | Admitting: Family Medicine

## 2020-12-04 ENCOUNTER — Other Ambulatory Visit: Payer: Self-pay

## 2020-12-04 DIAGNOSIS — N6310 Unspecified lump in the right breast, unspecified quadrant: Secondary | ICD-10-CM | POA: Diagnosis present

## 2020-12-04 DIAGNOSIS — N6311 Unspecified lump in the right breast, upper outer quadrant: Secondary | ICD-10-CM | POA: Diagnosis not present

## 2021-05-28 ENCOUNTER — Other Ambulatory Visit: Payer: Self-pay | Admitting: Family Medicine

## 2021-05-28 DIAGNOSIS — R928 Other abnormal and inconclusive findings on diagnostic imaging of breast: Secondary | ICD-10-CM

## 2021-06-19 ENCOUNTER — Other Ambulatory Visit: Payer: Self-pay | Admitting: Family Medicine

## 2021-06-19 DIAGNOSIS — R928 Other abnormal and inconclusive findings on diagnostic imaging of breast: Secondary | ICD-10-CM

## 2021-07-03 ENCOUNTER — Other Ambulatory Visit: Payer: Medicaid Other

## 2021-07-03 ENCOUNTER — Inpatient Hospital Stay: Admission: RE | Admit: 2021-07-03 | Payer: Medicaid Other | Source: Ambulatory Visit

## 2021-07-16 ENCOUNTER — Ambulatory Visit
Admission: RE | Admit: 2021-07-16 | Discharge: 2021-07-16 | Disposition: A | Discharge status: Home / Self Care | Payer: Medicaid Other | Source: Ambulatory Visit | Attending: Family Medicine | Admitting: Family Medicine

## 2021-07-16 ENCOUNTER — Other Ambulatory Visit: Payer: Self-pay

## 2021-07-16 DIAGNOSIS — R928 Other abnormal and inconclusive findings on diagnostic imaging of breast: Secondary | ICD-10-CM | POA: Diagnosis present

## 2021-09-20 IMAGING — MG MM DIGITAL DIAGNOSTIC UNILAT*R* W/ TOMO W/ CAD
6 of 10 series · 6 of 30 positions shown · non-contrast
Comparison: Previous exam(s).

CLINICAL DATA: Screening recall for a possible right breast mass.

EXAM:
DIGITAL DIAGNOSTIC UNILATERAL RIGHT MAMMOGRAM WITH TOMO AND CAD;
ULTRASOUND RIGHT BREAST LIMITED

[R ML synth-2D]
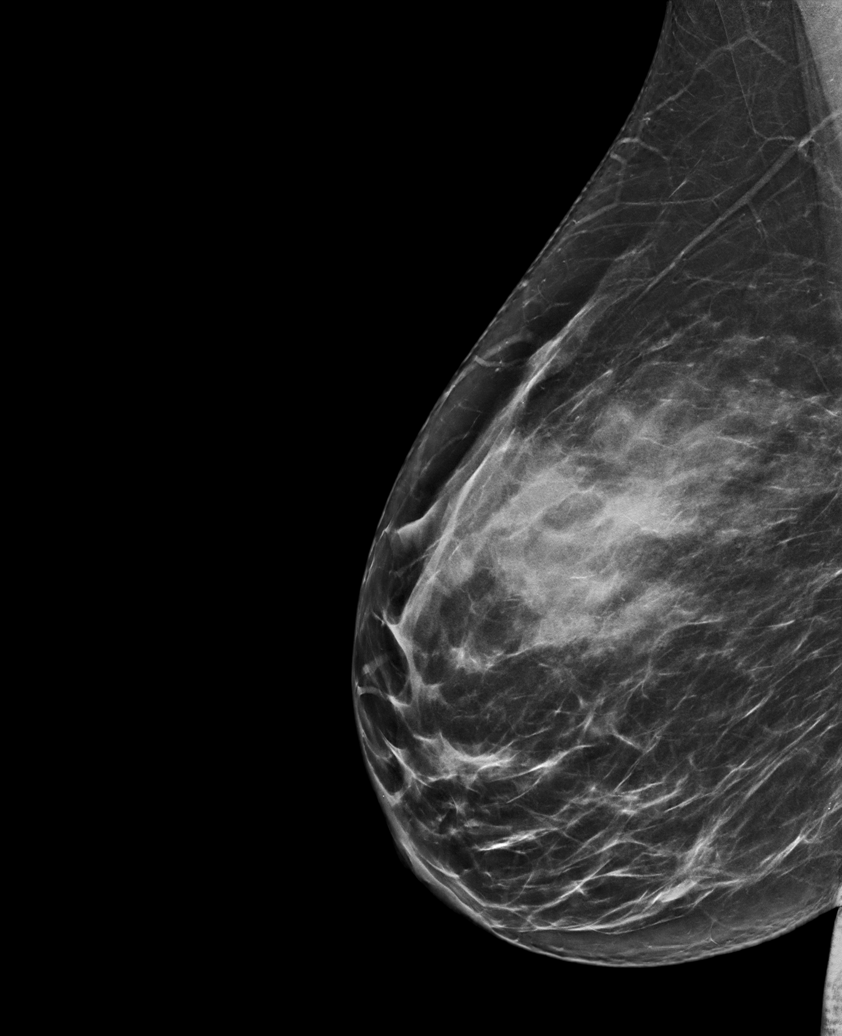

[R MLO synth-2D (1 of 2)]
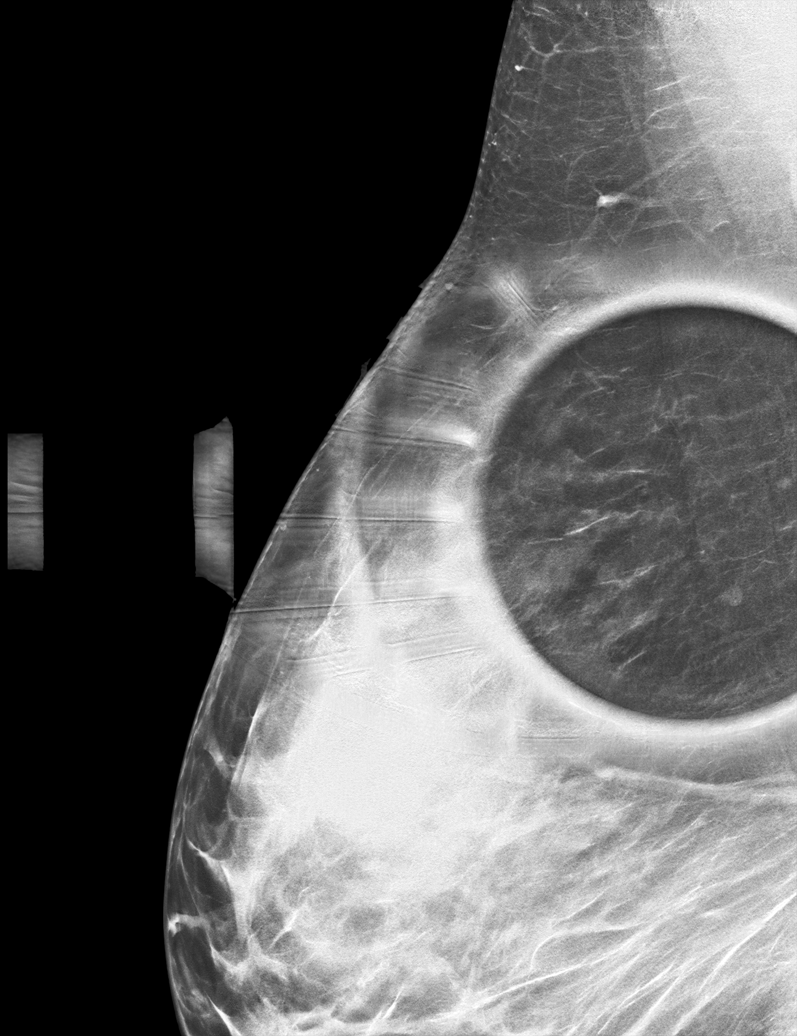

[R CC synth-2D (1 of 2)]
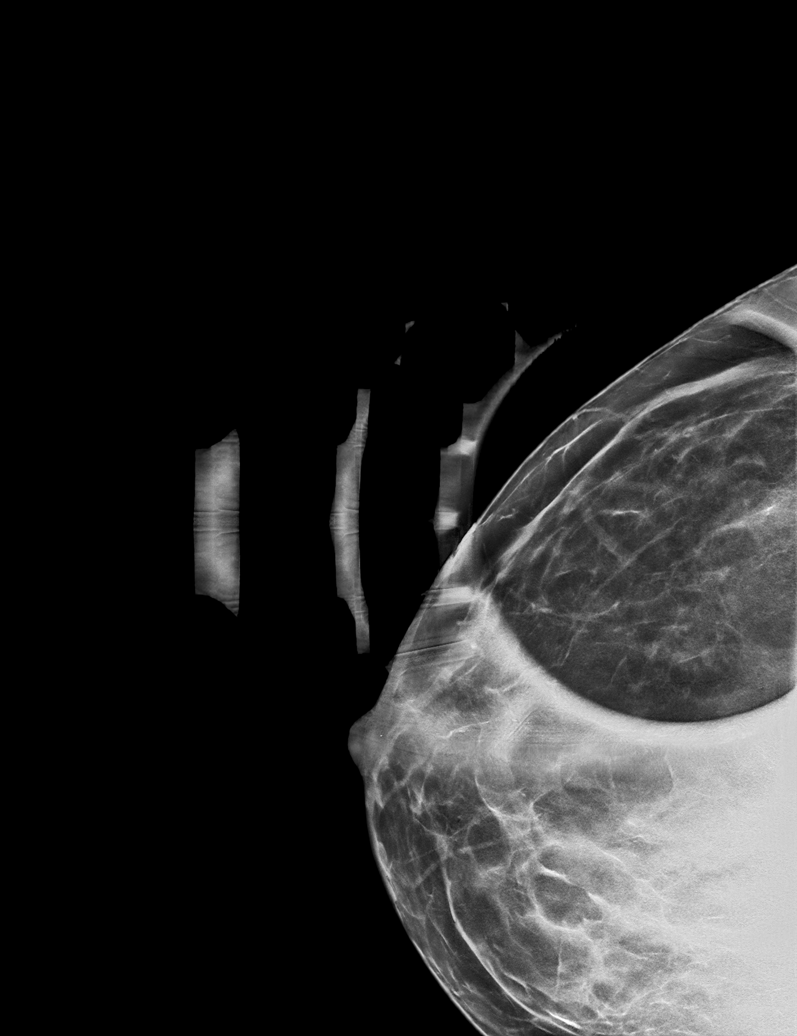

[R MLO synth-2D (2 of 2)]
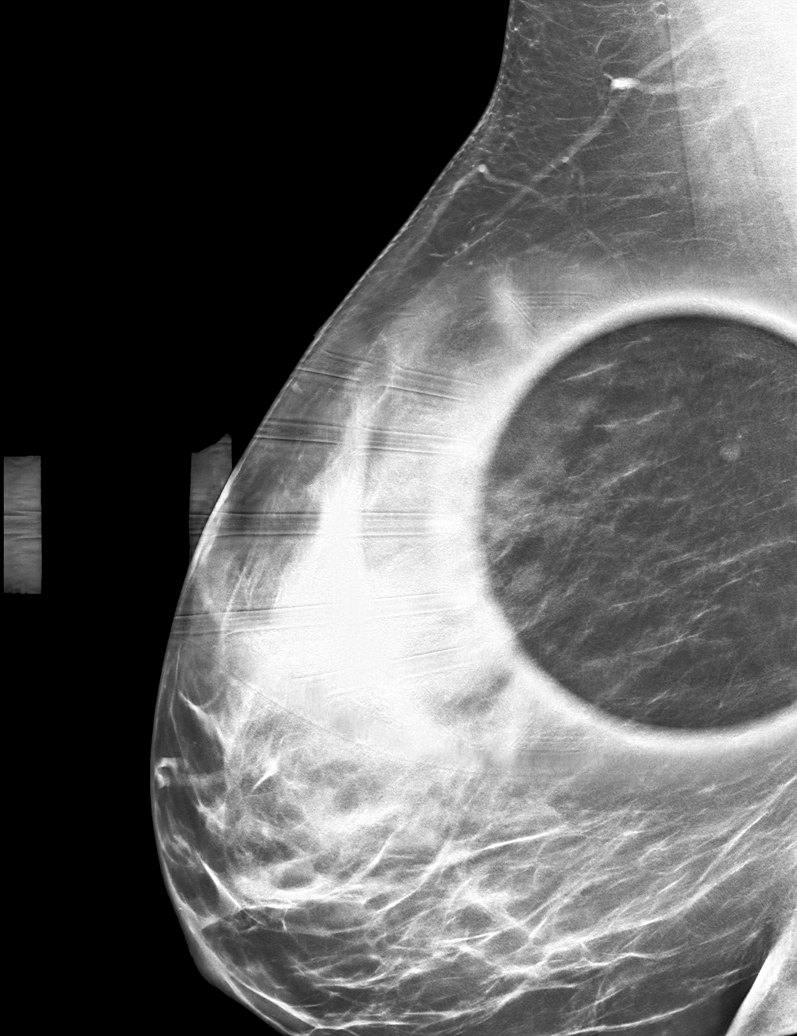

[R CC synth-2D (2 of 2)]
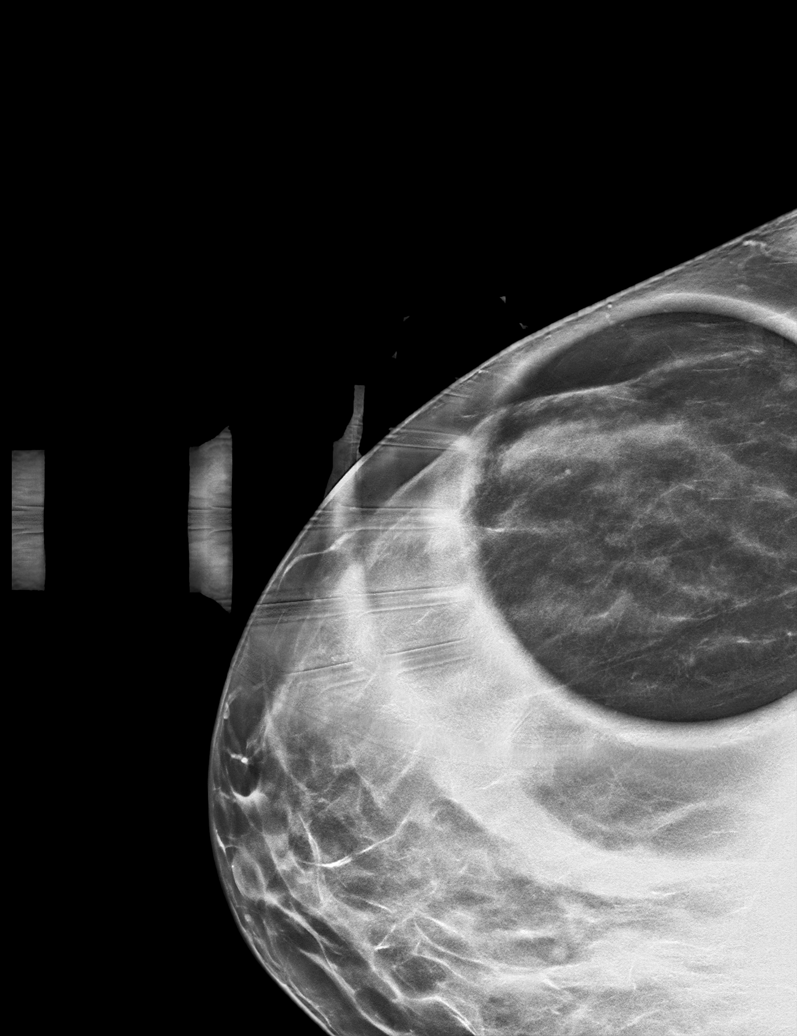

[R CC tomo · tomo slice 29/57.0]
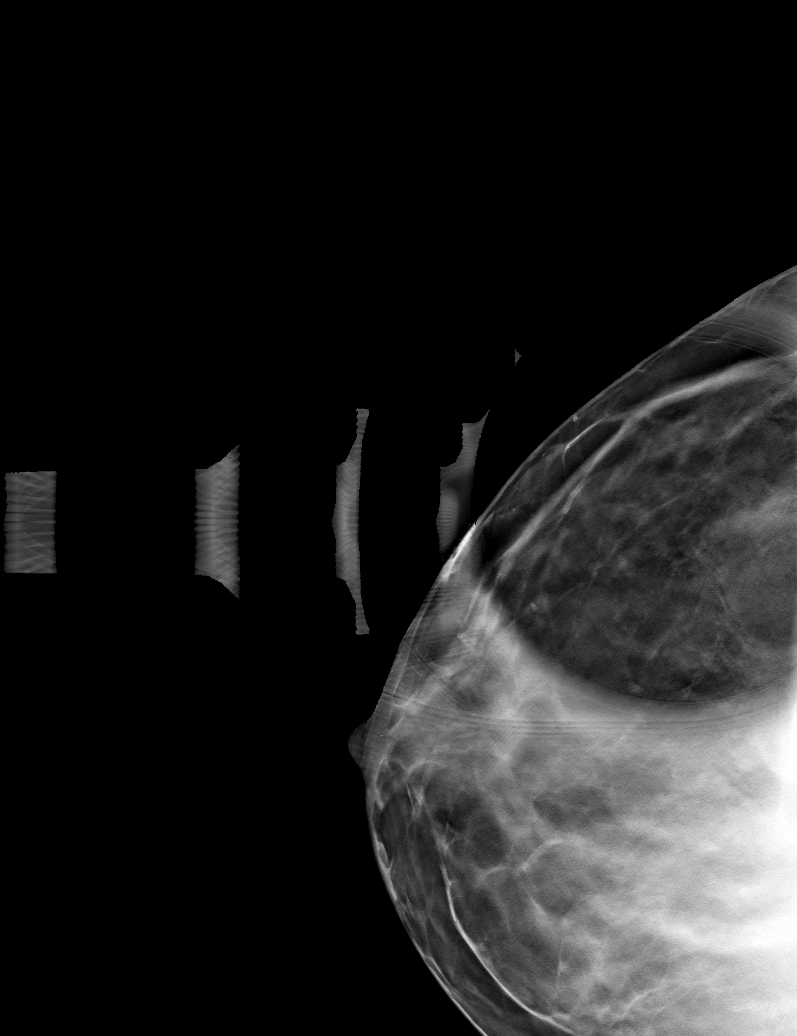

[6 of 30 positions shown; findings below may reference images not displayed]

ACR Breast Density Category c: The breast tissue is heterogeneously
dense, which may obscure small masses.
FINDINGS: Spot compression tomosynthesis images through the lateral posterior
right breast demonstrates an obscured oval mass measuring
approximately 1.5 cm. No other suspicious masses or areas of
distortion are identified.

Mammographic images were processed with CAD.

Ultrasound targeted to the right breast at [DATE], 4 cm from the
nipple demonstrates a circumscribed oval hypoechoic mass measuring
1.2 x 0.5 x 0.9 cm. No blood flow is seen within the mass on color
Doppler imaging. No other masses or suspicious areas of shadowing
are identified in the lateral to upper-outer right breast.
IMPRESSION: 1. There is a likely benign 1.2 cm mass in the right breast at [DATE].
This is favored to represent a fibroadenoma versus a complicated
cyst.

RECOMMENDATION:
Six-month follow-up diagnostic right breast mammogram and
ultrasound.

I have discussed the findings and recommendations with the patient.
If applicable, a reminder letter will be sent to the patient
regarding the next appointment.

BI-RADS CATEGORY  3: Probably benign.

## 2021-12-22 ENCOUNTER — Ambulatory Visit
Admission: EM | Admit: 2021-12-22 | Discharge: 2021-12-22 | Disposition: A | Discharge status: Home / Self Care | Payer: BC Managed Care – PPO | Attending: Emergency Medicine | Admitting: Emergency Medicine

## 2021-12-22 ENCOUNTER — Encounter: Payer: Self-pay | Admitting: Emergency Medicine

## 2021-12-22 DIAGNOSIS — J029 Acute pharyngitis, unspecified: Secondary | ICD-10-CM | POA: Diagnosis not present

## 2021-12-22 LAB — POCT RAPID STREP A (OFFICE): Rapid Strep A Screen: NEGATIVE

## 2021-12-22 NOTE — ED Provider Notes (Signed)
?[facility] ? ? ? ?CSN: [id number] ?Arrival date & time: 12/22/21  1717 ? ? ?  ? ?History   ?Chief Complaint ?Chief Complaint  ?Patient presents with  ? Sore Throat  ? ? ?HPI ?Julia Gay is a 41 y.o. female.  Patient presents with 2-day history of sore throat.  She reports she was treated for strep throat approximately 1 month ago but her son tested positive last week and now she has a sore throat again.  She would like to be tested for strep.  She denies fever, chills, rash, cough, shortness of breath, or other symptoms. ? ?The history is provided by the patient and medical records.  ? ?Past Medical History:  ?Diagnosis Date  ? Hay fever   ? History of HPV infection   ? History of UTI   ? Retained placenta   ? ? ?Patient Active Problem List  ? Diagnosis Date Noted  ? Pregnancy 09/09/2018  ? Prolonged rupture of membranes 09/09/2018  ? Postpartum care following vaginal delivery 09/09/2018  ? History of preterm delivery, currently pregnant, unspecified trimester 04/08/2018  ? Supervision of high risk pregnancy, antepartum, first trimester 02/14/2018  ? History of pre-eclampsia in prior pregnancy, currently pregnant in first trimester 02/14/2018  ? Vitamin D deficiency 10/10/2015  ? AMA (advanced maternal age) multigravida 32+, first trimester 06/13/2015  ? Preventative health care 05/21/2015  ? Obsessive compulsive disorder 04/17/2015  ? ? ?Past Surgical History:  ?Procedure Laterality Date  ? DILATION AND CURETTAGE OF UTERUS N/A 12/17/2015  ? Procedure: DILATATION AND CURETTAGE;  Surgeon: Brayton Mars, MD;  Location: [facility] ORS;  Service: Gynecology;  Laterality: N/A;  ? LEEP  2006  ? ? ?OB History   ? ? Gravida  ?4  ? Para  ?3  ? Term  ?2  ? Preterm  ?1  ? AB  ?1  ? Living  ?3  ?  ? ? SAB  ?1  ? IAB  ?   ? Ectopic  ?   ? Multiple  ?0  ? Live Births  ?3  ?   ?  ?  ? ? ? ?Home Medications   ? ?Prior to Admission medications   ?Medication Sig Start Date End Date Taking? Authorizing Provider   ?ALPRAZolam (XANAX) 0.25 MG tablet Take 1 tablet (0.25 mg total) by mouth at bedtime as needed for anxiety. 09/13/18   Rexene Agent, CNM  ?ferrous sulfate 325 (65 FE) MG tablet Take 325 mg by mouth daily with breakfast.    [provider]  ?norgestimate-ethinyl estradiol (ORTHO-CYCLEN,SPRINTEC,PREVIFEM) 0.25-35 MG-MCG tablet Take 2 pills po daily for 3 days then one pill po daily for remainder of pack 10/31/18   Gae Dry, MD  ?Prenatal Vit-Fe Fumarate-FA (PREPLUS) 27-1 MG TABS Take 1 tablet by mouth daily. 01/25/18   [provider]  ?sertraline (ZOLOFT) 50 MG tablet Take 1 tablet by mouth daily. Reported on 01/13/2016    [provider]  ?vitamin B-12 (CYANOCOBALAMIN) 500 MCG tablet Take 500 mcg by mouth daily.    [provider]  ? ? ?Family History ?Family History  ?Problem Relation Age of Onset  ? Alcohol abuse Father   ?     quit when pt was a child  ? Heart disease Father   ? Stroke Father   ? Hypertension Father   ? Aplastic anemia Father   ? Alcohol abuse Other   ?     both grandparents  ? Anemia  Mother   ? ? ?Social History ?Social History  ? ?Tobacco Use  ? Smoking status: Former  ? Smokeless tobacco: Never  ? Tobacco comments:  ?  quit in 2004  ?Vaping Use  ? Vaping Use: Never used  ?Substance Use Topics  ? Alcohol use: Yes  ?  Alcohol/week: 0.0 standard drinks  ?  Comment: socially  ? Drug use: No  ? ? ? ?Allergies   ?Patient has no known allergies. ? ? ?Review of Systems ?Review of Systems  ?Constitutional:  Negative for chills and fever.  ?HENT:  Positive for sore throat. Negative for ear pain.   ?Respiratory:  Negative for cough and shortness of breath.   ?Cardiovascular:  Negative for chest pain and palpitations.  ?Gastrointestinal:  Negative for diarrhea and vomiting.  ?Skin:  Negative for color change and rash.  ?All other systems reviewed and are negative. ? ? ?Physical Exam ?Triage Vital Signs ?ED Triage Vitals  ?Enc Vitals Group  ?   BP   ?   Pulse   ?    Resp   ?   Temp   ?   Temp src   ?   SpO2   ?   Weight   ?   Height   ?   Head Circumference   ?   Peak Flow   ?   Pain Score   ?   Pain Loc   ?   Pain Edu?   ?   Excl. in [place]?   ? ?No data found. ? ?Updated Vital Signs ?BP 126/80   Pulse 94   Temp 98.4 ?F (36.9 ?C)   Resp 18   LMP 12/13/2021   SpO2 98%  ? ?Visual Acuity ?Right Eye Distance:   ?Left Eye Distance:   ?Bilateral Distance:   ? ?Right Eye Near:   ?Left Eye Near:    ?Bilateral Near:    ? ?Physical Exam ?Vitals and nursing note reviewed.  ?Constitutional:   ?   General: She is not in acute distress. ?   Appearance: Normal appearance. She is well-developed. She is not ill-appearing.  ?HENT:  ?   Right Ear: Tympanic membrane normal.  ?   Left Ear: Tympanic membrane normal.  ?   Nose: Nose normal.  ?   Mouth/Throat:  ?   Mouth: Mucous membranes are moist.  ?   Pharynx: Posterior oropharyngeal erythema present.  ?Cardiovascular:  ?   Rate and Rhythm: Normal rate and regular rhythm.  ?   Heart sounds: Normal heart sounds.  ?Pulmonary:  ?   Effort: Pulmonary effort is normal. No respiratory distress.  ?   Breath sounds: Normal breath sounds.  ?Musculoskeletal:  ?   Cervical back: Neck supple.  ?Skin: ?   General: Skin is warm and dry.  ?Neurological:  ?   Mental Status: She is alert.  ?Psychiatric:     ?   Mood and Affect: Mood normal.  ? ? ? ?UC Treatments / Results  ?Labs ?(all labs ordered are listed, but only abnormal results are displayed) ?Labs Reviewed  ?CULTURE, GROUP A STREP [facility])  ?POCT RAPID STREP A (OFFICE)  ? ? ?EKG ? ? ?Radiology ?No results found. ? ?Procedures ?Procedures (including critical care time) ? ?Medications Ordered in UC ?Medications - No data to display ? ?Initial Impression / Assessment and Plan / UC Course  ?I have reviewed the triage vital signs and the nursing notes. ? ?Pertinent labs & imaging results that were available  during my care of the patient were reviewed by me and considered in my medical decision making (see  chart for details). ? ?  ?Sore throat.  Rapid strep negative; culture pending.  Discussed with patient that we will call her if the throat culture indicates the need for treatment.  Tylenol or ibuprofen as needed.  Instructed patient to follow up with her PCP if her symptoms are not improving.  She agrees to plan of care.  ? ? ?Final Clinical Impressions(s) / UC Diagnoses  ? ?Final diagnoses:  ?Sore throat  ? ? ? ?Discharge Instructions   ? ?  ?Your rapid strep test is negative.  A throat culture is pending; we will call you if it is positive requiring treatment.   ? ?Follow up with your primary care provider if your symptoms are not improving.   ? ? ? ? ? ? ?ED Prescriptions   ?None ?  ? ?PDMP not reviewed this encounter. ?  ?Sharion Balloon, NP ?12/22/21 1802 ? ?

## 2021-12-22 NOTE — Discharge Instructions (Addendum)
Your rapid strep test is negative.  A throat culture is pending; we will call you if it is positive requiring treatment.    Follow up with your primary care provider if your symptoms are not improving.    

## 2021-12-22 NOTE — ED Triage Notes (Signed)
Pt presents with ST x 2 days.  °

## 2021-12-25 LAB — CULTURE, GROUP A STREP (THRC)

## 2022-03-10 ENCOUNTER — Encounter: Payer: Self-pay | Admitting: Emergency Medicine

## 2022-03-10 ENCOUNTER — Ambulatory Visit
Admission: EM | Admit: 2022-03-10 | Discharge: 2022-03-10 | Disposition: A | Discharge status: Home / Self Care | Payer: BC Managed Care – PPO | Attending: Family Medicine | Admitting: Family Medicine

## 2022-03-10 DIAGNOSIS — H60391 Other infective otitis externa, right ear: Secondary | ICD-10-CM

## 2022-03-10 DIAGNOSIS — H938X2 Other specified disorders of left ear: Secondary | ICD-10-CM

## 2022-03-10 MED ORDER — AMOXICILLIN-POT CLAVULANATE 875-125 MG PO TABS: 1.0000 | ORAL_TABLET | Freq: Two times a day (BID) | ORAL | 0 refills | Status: DC

## 2022-03-10 MED ORDER — PREDNISONE 20 MG PO TABS: 20.0000 mg | ORAL_TABLET | Freq: Every day | ORAL | 0 refills | Status: AC

## 2022-03-10 NOTE — ED Triage Notes (Signed)
Pt presents with right ear pain and swelling x 3 days

## 2022-03-10 NOTE — ED Provider Notes (Signed)
Roderic Palau    CSN: [id number] Arrival date & time: 03/10/22  0805      History   Chief Complaint Chief Complaint  Patient presents with   Otalgia    HPI Julia Gay is a 41 y.o. female.   HPI Patient presents for evaluation of right ear pain and swelling of ear canal x 3 days.  Patient reports having a recurring sore in the inner ear she reports occasionally scraping the dried skin from the wound.  Endorses having some ear fullness over the course of the last few days.  However last night she noticed that her ear canal was swelling and upon awakening this morning she was having intense sharp throbbing pain and her entire ear canal was swollen.  She denies any other associated symptoms.  She has no current URI symptoms.  Endorses some decreased hearing due to the swelling in the right ear.  Denies any symptoms involving the left ear. Past Medical History:  Diagnosis Date   Hay fever    History of HPV infection    History of UTI    Retained placenta     Patient Active Problem List   Diagnosis Date Noted   Pregnancy 09/09/2018   Prolonged rupture of membranes 09/09/2018   Postpartum care following vaginal delivery 09/09/2018   History of preterm delivery, currently pregnant, unspecified trimester 04/08/2018   Supervision of high risk pregnancy, antepartum, first trimester 02/14/2018   History of pre-eclampsia in prior pregnancy, currently pregnant in first trimester 02/14/2018   Vitamin D deficiency 10/10/2015   AMA (advanced maternal age) multigravida 35+, first trimester 06/13/2015   Preventative health care 05/21/2015   Obsessive compulsive disorder 04/17/2015    Past Surgical History:  Procedure Laterality Date   DILATION AND CURETTAGE OF UTERUS N/A 12/17/2015   Procedure: DILATATION AND CURETTAGE;  Surgeon: Brayton Mars, MD;  Location: [facility] ORS;  Service: Gynecology;  Laterality: N/A;   LEEP  2006    OB History     Gravida  4   Para  3    Term  2   Preterm  1   AB  1   Living  3      SAB  1   IAB      Ectopic      Multiple  0   Live Births  3            Home Medications    Prior to Admission medications   Medication Sig Start Date End Date Taking? Authorizing Provider  amoxicillin-clavulanate (AUGMENTIN) 875-125 MG tablet Take 1 tablet by mouth 2 (two) times daily. 03/10/22  Yes Scot Jun, FNP  predniSONE (DELTASONE) 20 MG tablet Take 1 tablet (20 mg total) by mouth daily with breakfast for 3 days. 03/10/22 03/13/22 Yes Scot Jun, FNP  ALPRAZolam Duanne Moron) 0.25 MG tablet Take 1 tablet (0.25 mg total) by mouth at bedtime as needed for anxiety. 09/13/18   Rexene Agent, CNM  ferrous sulfate 325 (65 FE) MG tablet Take 325 mg by mouth daily with breakfast.    [provider]  norgestimate-ethinyl estradiol (ORTHO-CYCLEN,SPRINTEC,PREVIFEM) 0.25-35 MG-MCG tablet Take 2 pills po daily for 3 days then one pill po daily for remainder of pack 10/31/18   Gae Dry, MD  Prenatal Vit-Fe Fumarate-FA (PREPLUS) 27-1 MG TABS Take 1 tablet by mouth daily. 01/25/18   [provider]  sertraline (ZOLOFT) 50 MG tablet Take 1 tablet by mouth daily. Reported on  01/13/2016    [provider]  vitamin B-12 (CYANOCOBALAMIN) 500 MCG tablet Take 500 mcg by mouth daily.    [provider]    Family History Family History  Problem Relation Age of Onset   Alcohol abuse Father        quit when pt was a child   Heart disease Father    Stroke Father    Hypertension Father    Aplastic anemia Father    Alcohol abuse Other        both grandparents   Anemia Mother     Social History Social History   Tobacco Use   Smoking status: Former   Smokeless tobacco: Never   Tobacco comments:    quit in 2004  Vaping Use   Vaping Use: Never used  Substance Use Topics   Alcohol use: Yes    Alcohol/week: 0.0 standard drinks of alcohol    Comment: socially   Drug use: No      Allergies   Patient has no known allergies.   Review of Systems Review of Systems Pertinent negatives listed in HPI   Physical Exam Triage Vital Signs ED Triage Vitals  Enc Vitals Group     BP 03/10/22 0816 122/86     Pulse Rate 03/10/22 0816 84     Resp 03/10/22 0816 16     Temp 03/10/22 0816 98.6 F (37 C)     Temp Source 03/10/22 0816 Oral     SpO2 03/10/22 0816 98 %     Weight --      Height --      Head Circumference --      Peak Flow --      Pain Score 03/10/22 0815 5     Pain Loc --      Pain Edu? --      Excl. in [place]? --    No data found.  Updated Vital Signs BP 122/86 (BP Location: Left Arm)   Pulse 84   Temp 98.6 F (37 C) (Oral)   Resp 16   LMP 03/03/2022 (Approximate)   SpO2 98%   Visual Acuity Right Eye Distance:   Left Eye Distance:   Bilateral Distance:    Right Eye Near:   Left Eye Near:    Bilateral Near:     Physical Exam Constitutional:      Appearance: Normal appearance.  HENT:     Head: Normocephalic.     Right Ear: Drainage, swelling and tenderness present.     Left Ear: Hearing, tympanic membrane, ear canal and external ear normal.     Ears:     Comments: TM is not viewable due to severe swelling in the right ear    Nose: Nose normal.     Mouth/Throat:     Lips: Pink.     Mouth: Mucous membranes are moist.  Cardiovascular:     Rate and Rhythm: Normal rate and regular rhythm.  Pulmonary:     Effort: Pulmonary effort is normal.     Breath sounds: Normal breath sounds.  Skin:    General: Skin is warm.     Capillary Refill: Capillary refill takes less than 2 seconds.  Neurological:     Mental Status: She is alert.  Psychiatric:        Attention and Perception: Attention normal.        Mood and Affect: Mood normal.      UC Treatments / Results  Labs (all labs ordered  are listed, but only abnormal results are displayed) Labs Reviewed - No data to display  EKG   Radiology No results  found.  Procedures Procedures (including critical care time)  Medications Ordered in UC Medications - No data to display  Initial Impression / Assessment and Plan / UC Course  I have reviewed the triage vital signs and the nursing notes.  Pertinent labs & imaging results that were available during my care of the patient were reviewed by me and considered in my medical decision making (see chart for details).    Infective otitis externa, right with right ear swelling Prednisone 20 mg daily x 3 days Augmentin BID x 10 days. Avoid water entering the ear. Return if symptoms worsen or doesn't improve. Final Clinical Impressions(s) / UC Diagnoses   Final diagnoses:  Infective otitis externa of right ear  Swelling of ear, left   Discharge Instructions   None    ED Prescriptions     Medication Sig Dispense Auth. Provider   predniSONE (DELTASONE) 20 MG tablet Take 1 tablet (20 mg total) by mouth daily with breakfast for 3 days. 3 tablet Scot Jun, FNP   amoxicillin-clavulanate (AUGMENTIN) 875-125 MG tablet Take 1 tablet by mouth 2 (two) times daily. 20 tablet Scot Jun, FNP      PDMP not reviewed this encounter.   Scot Jun, [place] 03/10/22 [phone]

## 2022-03-21 IMAGING — US US BREAST*R* LIMITED INC AXILLA
1 series · 5 of 5 positions shown · non-contrast
Comparison: Previous exam(s).

CLINICAL DATA: Here for follow-up of a probably benign mass in the
right breast.

EXAM:
DIGITAL DIAGNOSTIC UNILATERAL RIGHT MAMMOGRAM WITH TOMOSYNTHESIS AND
CAD; ULTRASOUND RIGHT BREAST LIMITED
TECHNIQUE: Right digital diagnostic mammography and breast tomosynthesis was
performed. The images were evaluated with computer-aided detection.;
Targeted ultrasound examination of the right breast was performed

[Series 1: us breast*right* limited inc axilla · 0.07mm/px · 5 of 5 slices shown]
[im 1/5]
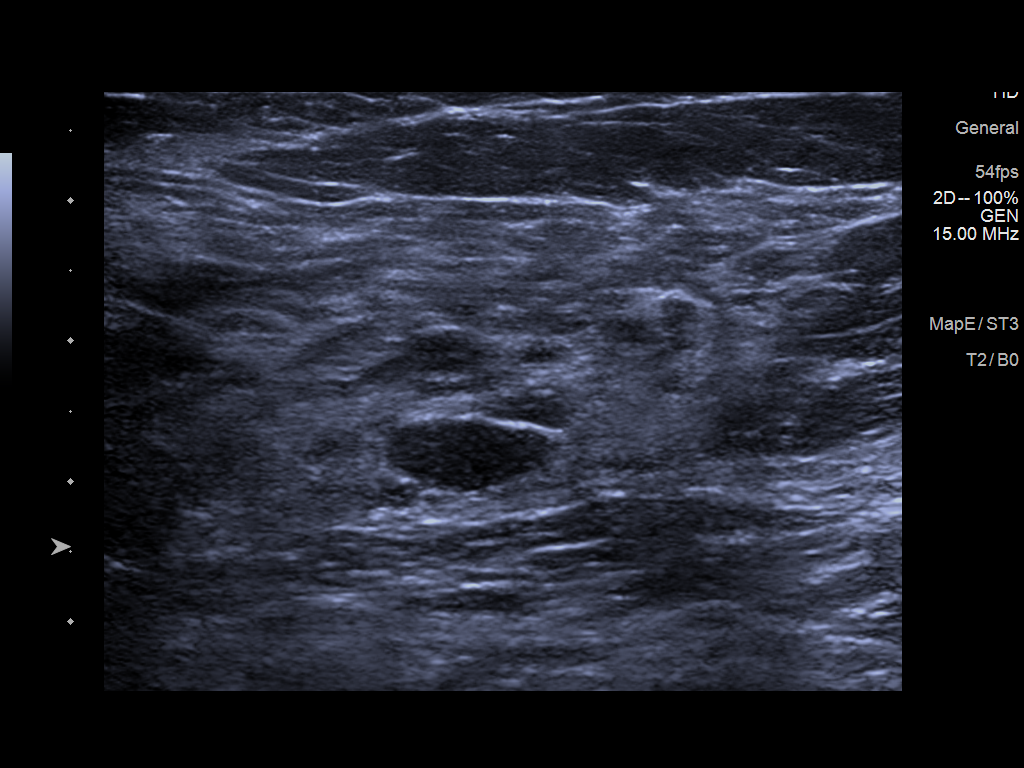
[im 2/5]
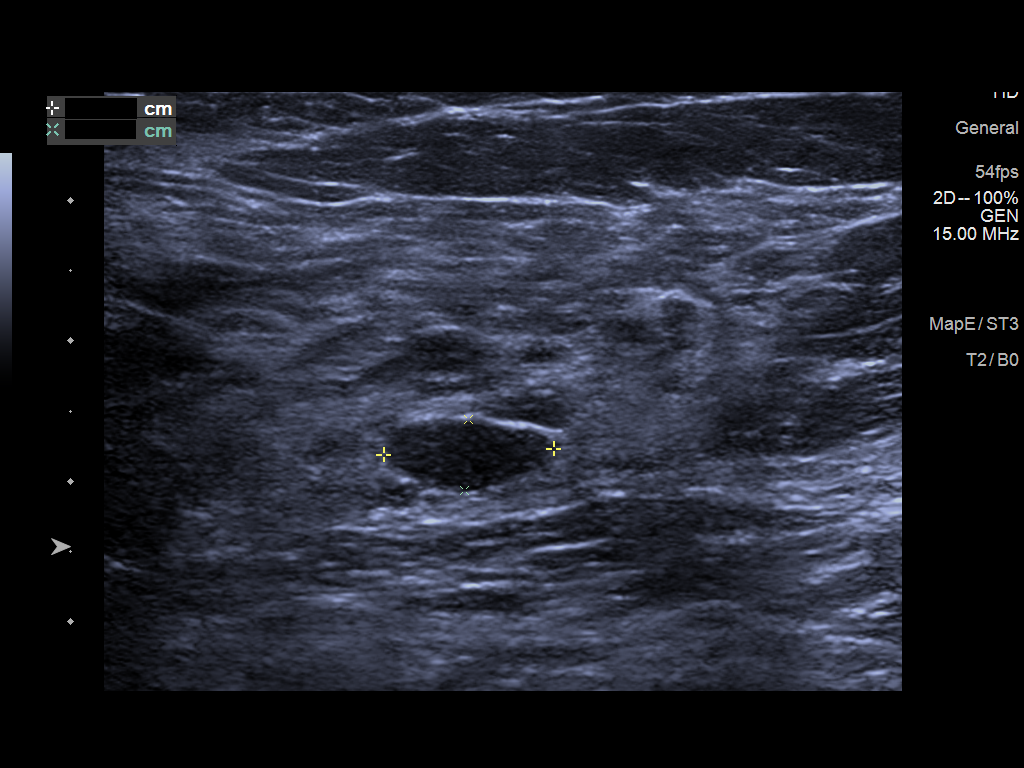
[im 3/5]
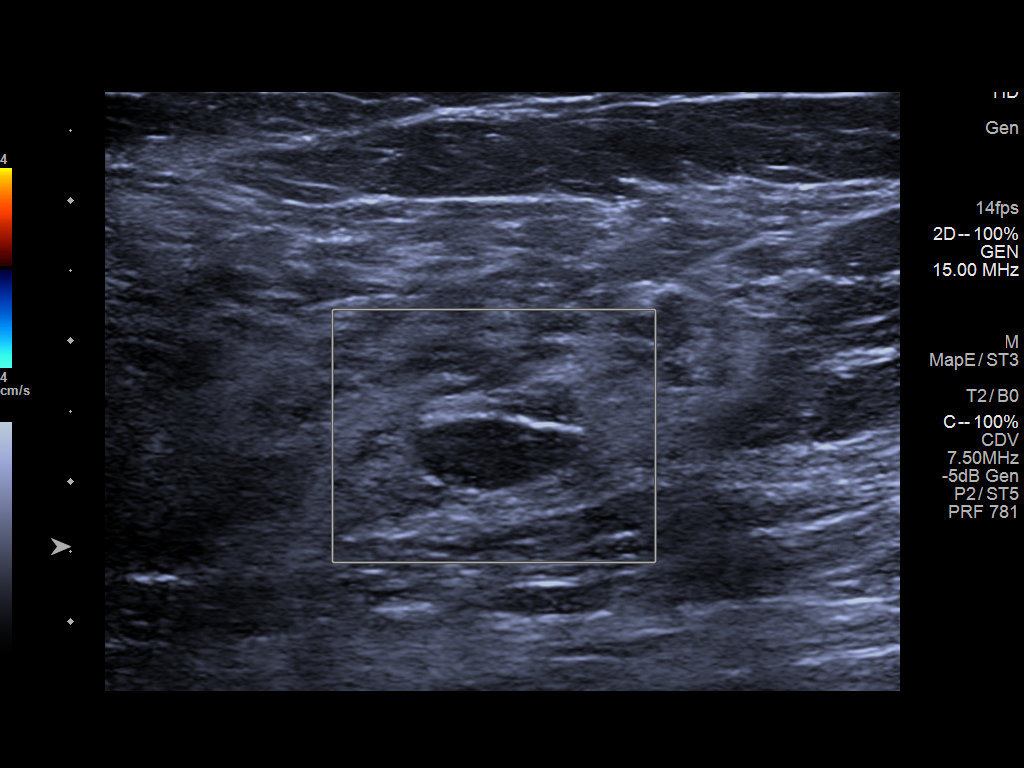
[im 4/5]
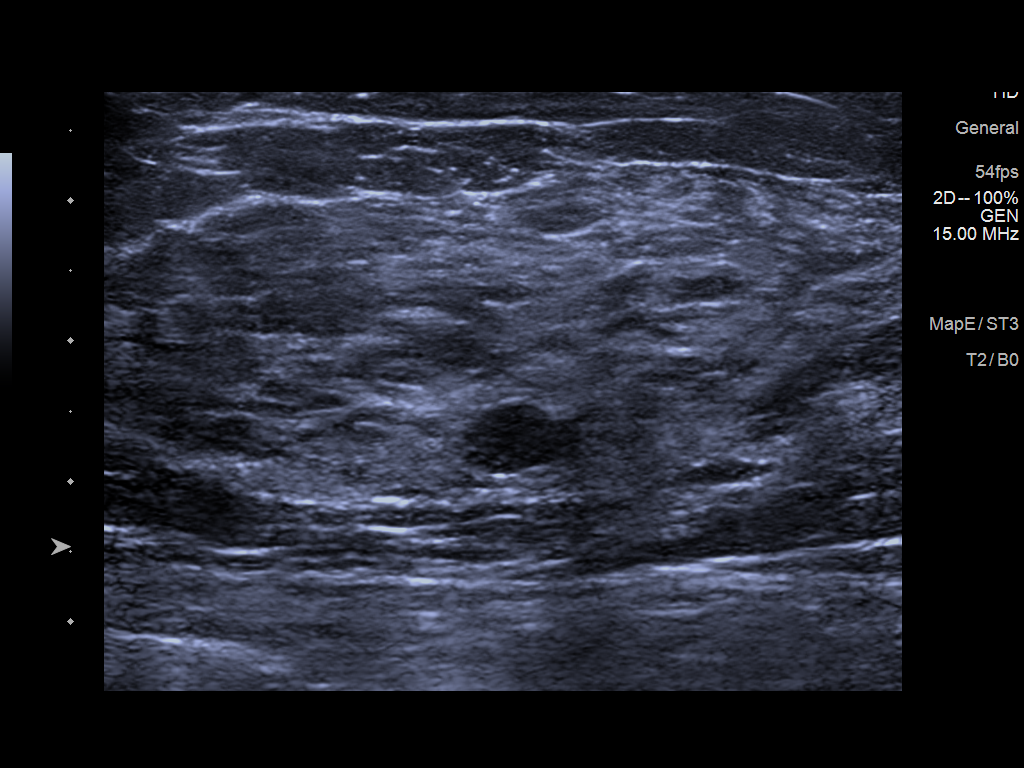
[im 5/5]
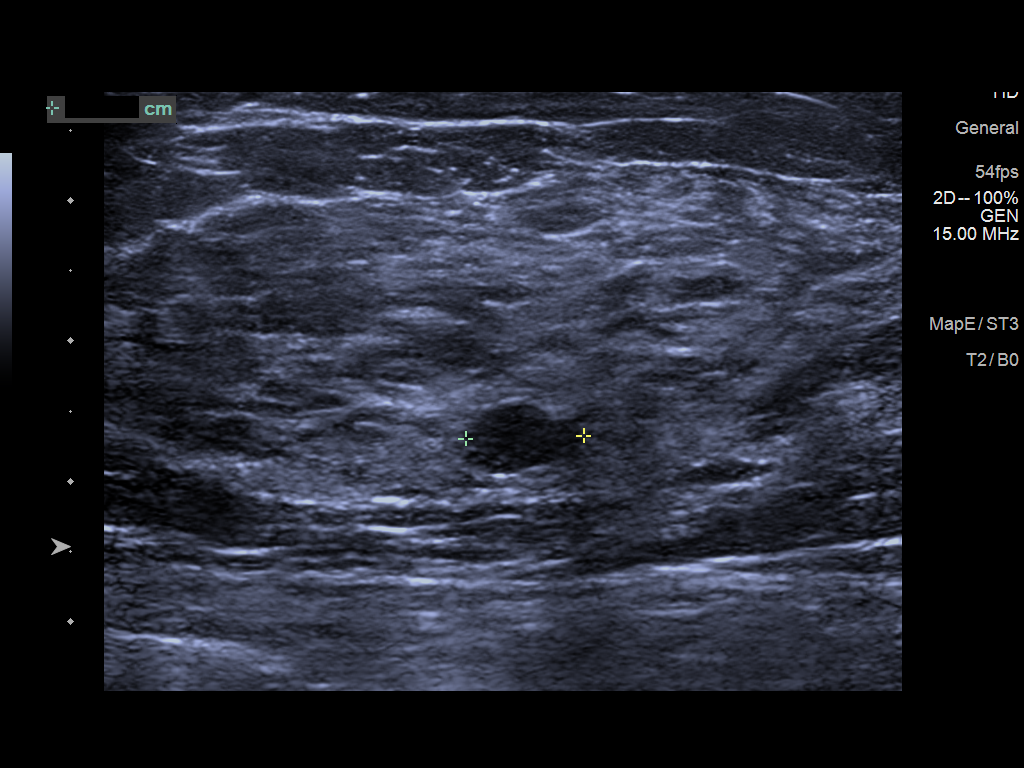

[5 of 5 positions shown; findings below may reference images not displayed]

ACR Breast Density Category c: The breast tissue is heterogeneously
dense, which may obscure small masses.
FINDINGS: In the upper outer right breast on the MLO view only an oval
obscured mass measuring 1.2 cm appears unchanged since 05/28/2020.

Targeted right breast ultrasound was performed.

At 9:30 o'clock 4 cm from the nipple an oval circumscribed
hypoechoic mass measures 1.2 x 0.5 x 0.8 cm. This is not
significantly changed since 06/05/2020.
IMPRESSION: Circumscribed mass in the right breast is not significantly changed
since 06/05/2020 and remains probably benign.

RECOMMENDATION:
Recommend diagnostic right breast ultrasound in 6 months to document
1 year of stability. At that time, the patient will be due for
annual bilateral mammogram.

I have discussed the findings and recommendations with the patient.
If applicable, a reminder letter will be sent to the patient
regarding the next appointment.

BI-RADS CATEGORY  3: Probably benign.

## 2022-06-08 ENCOUNTER — Institutional Professional Consult (permissible substitution): Payer: BC Managed Care – PPO | Admitting: Student in an Organized Health Care Education/Training Program

## 2022-06-22 ENCOUNTER — Ambulatory Visit: Payer: BC Managed Care – PPO | Admitting: Student in an Organized Health Care Education/Training Program

## 2022-06-22 ENCOUNTER — Encounter: Payer: Self-pay | Admitting: Student in an Organized Health Care Education/Training Program

## 2022-06-22 VITALS — BP 120/82 | HR 78 | Temp 98.2°F | Ht 64.0 in | Wt 204.4 lb

## 2022-06-22 DIAGNOSIS — R0602 Shortness of breath: Secondary | ICD-10-CM | POA: Diagnosis not present

## 2022-06-22 DIAGNOSIS — J4520 Mild intermittent asthma, uncomplicated: Secondary | ICD-10-CM

## 2022-06-22 LAB — NITRIC OXIDE: Nitric Oxide: 70

## 2022-06-22 MED ORDER — BUDESONIDE-FORMOTEROL FUMARATE 160-4.5 MCG/ACT IN AERO: 2.0000 | INHALATION_SPRAY | Freq: Two times a day (BID) | RESPIRATORY_TRACT | 12 refills | Status: AC

## 2022-06-22 NOTE — Patient Instructions (Signed)
Today, I ordered blood work. You can get them draw at your preferred [facility] draw station. The nearest one to [facility] is at nearby [facility] ([address], [place], [place] [address]). 

## 2022-06-22 NOTE — Progress Notes (Signed)
Synopsis: Referred in for bronchitis by Marguerita Merles, MD  Assessment & Plan:   #Mild Intermittent Asthma  Ms. Yoshino reports symptoms consistent with asthma given shortness of breath, cough, and wheeze. This is consistent with her history of exercise induced asthma. Today, a FENO level was elevated at 70 ppb. I will obtain a pulmonary function test to assess spirometry pre and post albuterol, in addition to obtaining a CBC with differential and an Allergen panel to assess for type 2 airway inflammation. For management, I will initiate Symbicort to be used twice daily and as needed per GINA recommendations.  - Pulmonary Function Test [facility] Only; Future - Allergen Panel (27) + IGE - CBC with Differential/Platelet - Nitric oxide - budesonide-formoterol (SYMBICORT) 160-4.5 MCG/ACT inhaler; Inhale 2 puffs into the lungs 2 (two) times daily.  Dispense: 1 each; Refill: 12   Return in about 3 months (around 09/22/2022).  I spent 45 minutes caring for this patient today, including preparing to see the patient, obtaining and/or reviewing separately obtained history, performing a medically appropriate examination and/or evaluation, counseling and educating the patient/family/caregiver, ordering medications, tests, or procedures, and documenting clinical information in the electronic health record  Armando Reichert, MD [facility] 06/22/2022 4:46 PM    End of visit medications:  Meds ordered this encounter  Medications   budesonide-formoterol (SYMBICORT) 160-4.5 MCG/ACT inhaler    Sig: Inhale 2 puffs into the lungs 2 (two) times daily.    Dispense:  1 each    Refill:  12     Current Outpatient Medications:    albuterol (VENTOLIN HFA) 108 (90 Base) MCG/ACT inhaler, Inhale into the lungs. Every 4 hours as needed, Disp: , Rfl:    budesonide-formoterol (SYMBICORT) 160-4.5 MCG/ACT inhaler, Inhale 2 puffs into the lungs 2 (two) times daily., Disp: 1 each, Rfl: 12   ferrous  sulfate 325 (65 FE) MG tablet, Take 325 mg by mouth daily with breakfast., Disp: , Rfl:    Multiple Vitamin (MULTIVITAMIN ADULT PO), Take 1 capsule by mouth daily., Disp: , Rfl:    omeprazole (PRILOSEC) 40 MG capsule, 1 Capsule(s) By Mouth Every Evening, Disp: , Rfl:    sertraline (ZOLOFT) 50 MG tablet, Take 1 tablet by mouth daily. Reported on 01/13/2016, Disp: , Rfl:    vitamin B-12 (CYANOCOBALAMIN) 500 MCG tablet, Take 500 mcg by mouth daily., Disp: , Rfl:    VITAMIN D, CHOLECALCIFEROL, PO, Take 1 tablet by mouth daily., Disp: , Rfl:    ALPRAZolam (XANAX) 0.25 MG tablet, Take 1 tablet (0.25 mg total) by mouth at bedtime as needed for anxiety. (Patient not taking: Reported on 06/22/2022), Disp: 30 tablet, Rfl: 0   amoxicillin-clavulanate (AUGMENTIN) 875-125 MG tablet, Take 1 tablet by mouth 2 (two) times daily. (Patient not taking: Reported on 06/22/2022), Disp: 20 tablet, Rfl: 0   norgestimate-ethinyl estradiol (ORTHO-CYCLEN,SPRINTEC,PREVIFEM) 0.25-35 MG-MCG tablet, Take 2 pills po daily for 3 days then one pill po daily for remainder of pack (Patient not taking: Reported on 06/22/2022), Disp: 1 Package, Rfl: 11   Prenatal Vit-Fe Fumarate-FA (PREPLUS) 27-1 MG TABS, Take 1 tablet by mouth daily. (Patient not taking: Reported on 06/22/2022), Disp: , Rfl: 2   Subjective:   PATIENT ID: Julia Gay: female DOB: 1981-02-03, MRN: [id number]  Chief Complaint  Patient presents with   Cough    Chronic Bronchitis with cough.    HPI  Julia Gay is a pleasant 41 year old female presenting today for the evaluation and workup  of shortness of breath, cough, and bronchitis.  She reports coming down with bronchitis every other month. The symptoms start suddenly and progress with cough productive of greenish sputum and shortness of breath. During said episodes, she describes a shortness of breath associated with chest tightness as well as an audible wheeze. She usually has to take prednisone in  addition to Flovent for the symptoms to resolve. She has had similar symptoms for many years now, and was diagnosed with exercise induced asthma by her primary care provider in the past. She was on Flovent at times as well as albuterol. She denies any fevers, chills, night sweats, or weight loss. She thinks she might have allergies and is waiting for a referral to an allergist. She does not report a runny nose at the time. She does have symptoms of reflux and heartburn for which she is on omeprazole.  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 She is continuously exposed to sick children given her line of work. She had worked for [employer] and they had a mold infestation which was recently dealt with. She is not sure if that had contributed to her symptoms. They have two dogs at home.  Ancillary information including prior medications, full medical/surgical/family/social histories, and PFTs (when available) are listed below and have been reviewed.   Review of Systems  Constitutional:  Negative for chills, fever and weight loss.  Respiratory:  Positive for cough, sputum production, shortness of breath and wheezing.   Cardiovascular:  Negative for chest pain.     Objective:   Vitals:   06/22/22 1610  BP: 120/82  Pulse: 78  Temp: 98.2 F (36.8 C)  SpO2: 98%  Weight: 204 lb 6.4 oz (92.7 kg)  Height: '5\' 4"'$  (1.626 m)   98% on RA BMI Readings from Last 3 Encounters:  06/22/22 35.09 kg/m  11/26/19 34.84 kg/m  11/25/19 34.84 kg/m   Wt Readings from Last 3 Encounters:  06/22/22 204 lb 6.4 oz (92.7 kg)  11/26/19 203 lb (92.1 kg)  11/25/19 203 lb (92.1 kg)    Physical Exam Constitutional:      Appearance: Normal appearance.  HENT:     Head: Normocephalic.     Nose: Nose normal.     Mouth/Throat:     Mouth: Mucous membranes are moist.  Eyes:     Extraocular Movements: Extraocular movements intact.  Cardiovascular:     Rate and Rhythm: Normal rate  and regular rhythm.     Pulses: Normal pulses.     Heart sounds: Normal heart sounds.  Pulmonary:     Effort: Pulmonary effort is normal.     Breath sounds: Normal breath sounds.  Musculoskeletal:     Cervical back: Normal range of motion.  Neurological:     General: No focal deficit present.     Mental Status: She is alert and oriented to person, place, and time. Mental status is at baseline.       Ancillary Information    Past Medical History:  Diagnosis Date   Hay fever    History of HPV infection    History of UTI    Retained placenta      Family History  Problem Relation Age of Onset   Alcohol abuse Father        quit when pt was a child   Heart disease Father    Stroke Father    Hypertension Father    Aplastic anemia Father  Alcohol abuse Other        both grandparents   Anemia Mother      Past Surgical History:  Procedure Laterality Date   DILATION AND CURETTAGE OF UTERUS N/A 12/17/2015   Procedure: DILATATION AND CURETTAGE;  Surgeon: Brayton Mars, MD;  Location: [facility] ORS;  Service: Gynecology;  Laterality: N/A;   LEEP  2006    Social History   Socioeconomic History   Marital status: Married    Spouse name: Not on file   Number of children: Not on file   Years of education: Not on file   Highest education level: Not on file  Occupational History   Not on file  Tobacco Use   Smoking status: Former   Smokeless tobacco: Never   Tobacco comments:    quit in 2004  Vaping Use   Vaping Use: Never used  Substance and Sexual Activity   Alcohol use: Yes    Alcohol/week: 0.0 standard drinks of alcohol    Comment: socially   Drug use: No   Sexual activity: Not on file  Other Topics Concern   Not on file  Social History Narrative   Married.    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   Has 1 child.   Enjoys playing soccer and tennis, singing, watching TV.   Social Determinants of Health   Financial Resource Strain: Not on  file  Food Insecurity: Not on file  Transportation Needs: Not on file  Physical Activity: Not on file  Stress: Not on file  Social Connections: Not on file  Intimate Partner Violence: Not on file     No Known Allergies   CBC    Component Value Date/Time   WBC 10.8 (H) 11/25/2019 2138   RBC 4.90 11/25/2019 2138   HGB 13.5 11/25/2019 2138   HGB 10.4 (L) 06/27/2018 0941   HCT 41.6 11/25/2019 2138   HCT 31.7 (L) 06/27/2018 0941   PLT 302 11/25/2019 2138   PLT 195 06/27/2018 0941   MCV 84.9 11/25/2019 2138   MCV 86 06/27/2018 0941   MCV 83 06/01/2012 0320   MCH 27.6 11/25/2019 2138   MCHC 32.5 11/25/2019 2138   RDW 12.8 11/25/2019 2138   RDW 12.4 06/27/2018 0941   RDW 13.6 06/01/2012 0320   LYMPHSABS 2.5 11/25/2019 2138   LYMPHSABS 1.4 06/27/2018 0941   LYMPHSABS 4.8 (H) 06/01/2012 0320   MONOABS 0.6 11/25/2019 2138   MONOABS 0.8 06/01/2012 0320   EOSABS 0.4 11/25/2019 2138   EOSABS 0.2 06/27/2018 0941   EOSABS 0.3 06/01/2012 0320   BASOSABS 0.0 11/25/2019 2138   BASOSABS 0.0 06/27/2018 0941   BASOSABS 0.1 06/01/2012 0320    Pulmonary Functions Testing Results:     No data to display          Outpatient Medications Prior to Visit  Medication Sig Dispense Refill   albuterol (VENTOLIN HFA) 108 (90 Base) MCG/ACT inhaler Inhale into the lungs. Every 4 hours as needed     ferrous sulfate 325 (65 FE) MG tablet Take 325 mg by mouth daily with breakfast.     Multiple Vitamin (MULTIVITAMIN ADULT PO) Take 1 capsule by mouth daily.     omeprazole (PRILOSEC) 40 MG capsule 1 Capsule(s) By Mouth Every Evening     sertraline (ZOLOFT) 50 MG tablet Take 1 tablet by mouth daily. Reported on 01/13/2016     vitamin B-12 (CYANOCOBALAMIN) 500 MCG tablet Take 500 mcg by mouth daily.  VITAMIN D, CHOLECALCIFEROL, PO Take 1 tablet by mouth daily.     FLOVENT HFA 110 MCG/ACT inhaler Inhale 2 puffs into the lungs daily.     ALPRAZolam (XANAX) 0.25 MG tablet Take 1 tablet (0.25 mg total)  by mouth at bedtime as needed for anxiety. (Patient not taking: Reported on 06/22/2022) 30 tablet 0   amoxicillin-clavulanate (AUGMENTIN) 875-125 MG tablet Take 1 tablet by mouth 2 (two) times daily. (Patient not taking: Reported on 06/22/2022) 20 tablet 0   norgestimate-ethinyl estradiol (ORTHO-CYCLEN,SPRINTEC,PREVIFEM) 0.25-35 MG-MCG tablet Take 2 pills po daily for 3 days then one pill po daily for remainder of pack (Patient not taking: Reported on 06/22/2022) 1 Package 11   Prenatal Vit-Fe Fumarate-FA (PREPLUS) 27-1 MG TABS Take 1 tablet by mouth daily. (Patient not taking: Reported on 06/22/2022)  2   No facility-administered medications prior to visit.

## 2022-06-24 LAB — ALLERGEN PANEL (27) + IGE
Alternaria Alternata IgE: <0.1 kU/L
Aspergillus Fumigatus IgE: <0.1 kU/L
Bahia Grass IgE: <0.1 kU/L
Bermuda Grass IgE: <0.1 kU/L
Cat Dander IgE: <0.1 kU/L
Cedar, Mountain IgE: <0.1 kU/L
Cladosporium Herbarum IgE: <0.1 kU/L
Cocklebur IgE: <0.1 kU/L
Cockroach, American IgE: <0.1 kU/L
Common Silver Birch IgE: <0.1 kU/L
D Farinae IgE: <0.1 kU/L
D Pteronyssinus IgE: 0.11 kU/L — AB
Dog Dander IgE: <0.1 kU/L
Elm, American IgE: <0.1 kU/L
Hickory, White IgE: 0.24 kU/L — AB
IgE (Immunoglobulin E), Serum: 66 IU/mL (ref 6–495)
Johnson Grass IgE: <0.1 kU/L
Kentucky Bluegrass IgE: <0.1 kU/L
Maple/Box Elder IgE: <0.1 kU/L
Mucor Racemosus IgE: <0.1 kU/L
Oak, White IgE: <0.1 kU/L
Penicillium Chrysogen IgE: <0.1 kU/L
Pigweed, Rough IgE: <0.1 kU/L
Plantain, English IgE: <0.1 kU/L
Ragweed, Short IgE: <0.1 kU/L
Setomelanomma Rostrat: <0.1 kU/L
Timothy Grass IgE: <0.1 kU/L
White Mulberry IgE: <0.1 kU/L

## 2022-06-24 LAB — CBC WITH DIFFERENTIAL/PLATELET
Basophils Absolute: 0 10*3/uL (ref 0.0–0.2)
Basos: 1 %
EOS (ABSOLUTE): 0.3 10*3/uL (ref 0.0–0.4)
Eos: 4 %
Hematocrit: 39 % (ref 34.0–46.6)
Hemoglobin: 12.9 g/dL (ref 11.1–15.9)
Immature Grans (Abs): 0 10*3/uL (ref 0.0–0.1)
Immature Granulocytes: 0 %
Lymphocytes Absolute: 2.4 10*3/uL (ref 0.7–3.1)
Lymphs: 28 %
MCH: 28.1 pg (ref 26.6–33.0)
MCHC: 33.1 g/dL (ref 31.5–35.7)
MCV: 85 fL (ref 79–97)
Monocytes Absolute: 0.4 10*3/uL (ref 0.1–0.9)
Monocytes: 5 %
Neutrophils Absolute: 5.3 10*3/uL (ref 1.4–7.0)
Neutrophils: 62 %
Platelets: 290 10*3/uL (ref 150–450)
RBC: 4.59 x10E6/uL (ref 3.77–5.28)
RDW: 12.1 % (ref 11.7–15.4)
WBC: 8.5 10*3/uL (ref 3.4–10.8)

## 2022-07-02 ENCOUNTER — Other Ambulatory Visit: Payer: Self-pay | Admitting: Family Medicine

## 2022-07-02 DIAGNOSIS — Z1231 Encounter for screening mammogram for malignant neoplasm of breast: Secondary | ICD-10-CM

## 2022-07-02 DIAGNOSIS — N63 Unspecified lump in unspecified breast: Secondary | ICD-10-CM

## 2022-07-22 ENCOUNTER — Ambulatory Visit
Admission: RE | Admit: 2022-07-22 | Discharge: 2022-07-22 | Disposition: A | Discharge status: Home / Self Care | Payer: BC Managed Care – PPO | Source: Ambulatory Visit | Attending: Family Medicine | Admitting: Family Medicine

## 2022-07-22 ENCOUNTER — Other Ambulatory Visit: Payer: BC Managed Care – PPO

## 2022-07-22 ENCOUNTER — Encounter: Payer: Self-pay | Admitting: Family Medicine

## 2022-07-22 ENCOUNTER — Other Ambulatory Visit: Payer: Self-pay | Admitting: Family Medicine

## 2022-07-22 DIAGNOSIS — R928 Other abnormal and inconclusive findings on diagnostic imaging of breast: Secondary | ICD-10-CM | POA: Insufficient documentation

## 2022-07-22 DIAGNOSIS — N63 Unspecified lump in unspecified breast: Secondary | ICD-10-CM

## 2022-07-27 ENCOUNTER — Other Ambulatory Visit: Payer: Self-pay | Admitting: Family Medicine

## 2022-07-27 DIAGNOSIS — N63 Unspecified lump in unspecified breast: Secondary | ICD-10-CM

## 2023-01-21 ENCOUNTER — Ambulatory Visit
Admission: RE | Admit: 2023-01-21 | Discharge: 2023-01-21 | Disposition: A | Discharge status: Home / Self Care | Payer: BC Managed Care – PPO | Source: Ambulatory Visit | Attending: Family Medicine | Admitting: Family Medicine

## 2023-01-21 DIAGNOSIS — N63 Unspecified lump in unspecified breast: Secondary | ICD-10-CM

## 2023-02-11 ENCOUNTER — Other Ambulatory Visit: Payer: Self-pay | Admitting: Family Medicine

## 2023-02-17 ENCOUNTER — Other Ambulatory Visit: Payer: Self-pay | Admitting: Family Medicine

## 2023-02-17 DIAGNOSIS — N63 Unspecified lump in unspecified breast: Secondary | ICD-10-CM

## 2023-03-08 ENCOUNTER — Ambulatory Visit
Admission: EM | Admit: 2023-03-08 | Discharge: 2023-03-08 | Disposition: A | Discharge status: Home / Self Care | Payer: BC Managed Care – PPO | Attending: Urgent Care | Admitting: Urgent Care

## 2023-03-08 DIAGNOSIS — R109 Unspecified abdominal pain: Secondary | ICD-10-CM

## 2023-03-08 DIAGNOSIS — H9202 Otalgia, left ear: Secondary | ICD-10-CM

## 2023-03-08 LAB — POCT URINALYSIS DIP (MANUAL ENTRY)
Bilirubin, UA: NEGATIVE
Blood, UA: NEGATIVE
Glucose, UA: NEGATIVE mg/dL
Ketones, POC UA: NEGATIVE mg/dL
Leukocytes, UA: NEGATIVE
Nitrite, UA: NEGATIVE
Protein Ur, POC: NEGATIVE mg/dL
Spec Grav, UA: 1.02 (ref 1.010–1.025)
Urobilinogen, UA: 0.2 E.U./dL
pH, UA: 7 (ref 5.0–8.0)

## 2023-03-08 NOTE — Discharge Instructions (Signed)
Follow up here or with your primary care provider if your symptoms are worsening or not improving.     

## 2023-03-08 NOTE — ED Triage Notes (Signed)
Patient presents to [facility] for UTI. Hx of UTIs. C/o abdominal grumbling and mid back pain since yesterday. Also reports left ear pain x 1 week.

## 2023-03-08 NOTE — ED Provider Notes (Signed)
Julia Gay    CSN: [id number] Arrival date & time: 03/08/23  1514      History   Chief Complaint No chief complaint on file.   HPI Julia Gay is a 42 y.o. female.   HPI  Presents to urgent care with 2 complaints:  She endorses abdominal discomfort.  She describes the pain as "rumbling" and thought she had gas.  She denies some episodes of loose stool a few days ago.  Also reports left otalgia x 3 days.  Discomfort radiating to her jaw.  Past Medical History:  Diagnosis Date   Hay fever    History of HPV infection    History of UTI    Retained placenta     Patient Active Problem List   Diagnosis Date Noted   Pregnancy 09/09/2018   Prolonged rupture of membranes 09/09/2018   Postpartum care following vaginal delivery 09/09/2018   History of preterm delivery, currently pregnant, unspecified trimester 04/08/2018   Supervision of high risk pregnancy, antepartum, first trimester 02/14/2018   History of pre-eclampsia in prior pregnancy, currently pregnant in first trimester 02/14/2018   Vitamin D deficiency 10/10/2015   AMA (advanced maternal age) multigravida 35+, first trimester 06/13/2015   Preventative health care 05/21/2015   Obsessive compulsive disorder 04/17/2015    Past Surgical History:  Procedure Laterality Date   DILATION AND CURETTAGE OF UTERUS N/A 12/17/2015   Procedure: DILATATION AND CURETTAGE;  Surgeon: Herold Harms, MD;  Location: [facility] ORS;  Service: Gynecology;  Laterality: N/A;   LEEP  2006    OB History     Gravida  4   Para  3   Term  2   Preterm  1   AB  1   Living  3      SAB  1   IAB      Ectopic      Multiple  0   Live Births  3            Home Medications    Prior to Admission medications   Medication Sig Start Date End Date Taking? Authorizing Provider  albuterol (VENTOLIN HFA) 108 (90 Base) MCG/ACT inhaler Inhale into the lungs. Every 4 hours as needed 04/16/22   [provider]   ALPRAZolam (XANAX) 0.25 MG tablet Take 1 tablet (0.25 mg total) by mouth at bedtime as needed for anxiety. Patient not taking: Reported on 06/22/2022 09/13/18   Oswaldo Conroy, CNM  amoxicillin-clavulanate (AUGMENTIN) 875-125 MG tablet Take 1 tablet by mouth 2 (two) times daily. Patient not taking: Reported on 06/22/2022 03/10/22   Bing Neighbors, NP  budesonide-formoterol [facility]) 160-4.5 MCG/ACT inhaler Inhale 2 puffs into the lungs 2 (two) times daily. 06/22/22 06/22/23  Raechel Chute, MD  ferrous sulfate 325 (65 FE) MG tablet Take 325 mg by mouth daily with breakfast.    [provider]  Multiple Vitamin (MULTIVITAMIN ADULT PO) Take 1 capsule by mouth daily.    [provider]  norgestimate-ethinyl estradiol (ORTHO-CYCLEN,SPRINTEC,PREVIFEM) 0.25-35 MG-MCG tablet Take 2 pills po daily for 3 days then one pill po daily for remainder of pack Patient not taking: Reported on 06/22/2022 10/31/18   Nadara Mustard, MD  omeprazole (PRILOSEC) 40 MG capsule 1 Capsule(s) By Mouth Every Evening 06/17/22   [provider]  Prenatal Vit-Fe Fumarate-FA (PREPLUS) 27-1 MG TABS Take 1 tablet by mouth daily. Patient not taking: Reported on 06/22/2022 01/25/18   [provider]  sertraline (ZOLOFT) 50 MG  tablet Take 1 tablet by mouth daily. Reported on 01/13/2016    [provider]  vitamin B-12 (CYANOCOBALAMIN) 500 MCG tablet Take 500 mcg by mouth daily.    [provider]  VITAMIN D, CHOLECALCIFEROL, PO Take 1 tablet by mouth daily.    [provider]    Family History Family History  Problem Relation Age of Onset   Alcohol abuse Father        quit when pt was a child   Heart disease Father    Stroke Father    Hypertension Father    Aplastic anemia Father    Alcohol abuse Other        both grandparents   Anemia Mother     Social History Social History   Tobacco Use   Smoking status: Former   Smokeless tobacco: Never   Tobacco  comments:    quit in 2004  Vaping Use   Vaping Use: Never used  Substance Use Topics   Alcohol use: Yes    Alcohol/week: 0.0 standard drinks of alcohol    Comment: socially   Drug use: No     Allergies   Patient has no known allergies.   Review of Systems Review of Systems   Physical Exam Triage Vital Signs ED Triage Vitals  Enc Vitals Group     BP      Pulse      Resp      Temp      Temp src      SpO2      Weight      Height      Head Circumference      Peak Flow      Pain Score      Pain Loc      Pain Edu?      Excl. in GC?    No data found.  Updated Vital Signs There were no vitals taken for this visit.  Visual Acuity Right Eye Distance:   Left Eye Distance:   Bilateral Distance:    Right Eye Near:   Left Eye Near:    Bilateral Near:     Physical Exam Vitals reviewed.  Constitutional:      Appearance: Normal appearance.  HENT:     Left Ear: Tympanic membrane and ear canal normal. There is no impacted cerumen.  Abdominal:     General: Bowel sounds are normal.     Palpations: Abdomen is soft.     Tenderness: There is abdominal tenderness.    Skin:    General: Skin is warm and dry.  Neurological:     General: No focal deficit present.     Mental Status: She is alert and oriented to person, place, and time.  Psychiatric:        Mood and Affect: Mood normal.        Behavior: Behavior normal.      UC Treatments / Results  Labs (all labs ordered are listed, but only abnormal results are displayed) Labs Reviewed - No data to display  EKG   Radiology No results found.  Procedures Procedures (including critical care time)  Medications Ordered in UC Medications - No data to display  Initial Impression / Assessment and Plan / UC Course  I have reviewed the triage vital signs and the nursing notes.  Pertinent labs & imaging results that were available during my care of the patient were reviewed by me and considered in my medical  decision making (  see chart for details).   Julia Gay is a 42 y.o. female presenting with L otalgia and R sided abdominal discomfort. Patient is afebrile without recent antipyretics, satting well on room air. Overall is well appearing though non-toxic, well hydrated, without respiratory distress.  Abdomen is soft with some mild tenderness on the right side.   The left external ear and ear canal are non-tender and without swelling. The canal is clear without discharge. The tympanic membrane is normal in appearance with normal landmarks and cone of light. Hearing is intact with good acuity to whispered voice.  Reviewed relevant chart history.   Considering sources of her abdominal discomfort including but not limited to infectious etiology, gallbladder, appendix, ovarian cyst or torsion. Physical exam is largely on remarkable. UA result is normal.   Negative Murphy's and McBurney sign.  No rebound tenderness.   After discussion with patient, agreed to watch and wait for worsening symptoms.  Informed her that we were unable to perform ultrasound or abdominal x-ray and so unable to evaluate her symptoms further.  Patient will proceed to ED if her symptoms worsen.  Left ear with possible otitis externa.  There is some mild erythema deep in the canal however otherwise unremarkable.  Again, agreed to watch and wait.  Final Clinical Impressions(s) / UC Diagnoses   Final diagnoses:  None   Discharge Instructions   None    ED Prescriptions   None    PDMP not reviewed this encounter.   Charma Igo, [place] 03/08/23 1556

## 2023-03-16 ENCOUNTER — Other Ambulatory Visit: Payer: Self-pay | Admitting: Physician Assistant

## 2023-03-16 DIAGNOSIS — R1084 Generalized abdominal pain: Secondary | ICD-10-CM

## 2023-07-22 ENCOUNTER — Other Ambulatory Visit: Payer: Self-pay

## 2023-07-22 DIAGNOSIS — N6320 Unspecified lump in the left breast, unspecified quadrant: Secondary | ICD-10-CM

## 2023-07-27 ENCOUNTER — Other Ambulatory Visit: Payer: BC Managed Care – PPO

## 2023-08-16 ENCOUNTER — Ambulatory Visit: Payer: Self-pay | Attending: Hematology and Oncology | Admitting: Hematology and Oncology

## 2023-08-16 ENCOUNTER — Ambulatory Visit
Admission: RE | Admit: 2023-08-16 | Discharge: 2023-08-16 | Disposition: A | Discharge status: Home / Self Care | Payer: Self-pay | Source: Ambulatory Visit | Attending: Obstetrics and Gynecology | Admitting: Obstetrics and Gynecology

## 2023-08-16 VITALS — BP 130/75 | Wt 205.0 lb

## 2023-08-16 DIAGNOSIS — N6320 Unspecified lump in the left breast, unspecified quadrant: Secondary | ICD-10-CM

## 2023-08-16 NOTE — Progress Notes (Signed)
[name] is a 42 y.o. female who presents to [facility] clinic today with no complaints.    Pap Smear: Pap not smear completed today. Last Pap smear was 10/20/2018 at [facility] clinic and was normal. Per patient has no history of an abnormal Pap smear. Last Pap smear result is available in Epic. 04/22/2020 - Negative; 10/20/2018 - Negative   Physical exam: Breasts Breasts symmetrical. No skin abnormalities bilateral breasts. No nipple retraction bilateral breasts. No nipple discharge bilateral breasts. No lymphadenopathy. No lumps palpated bilateral breasts.     MM DIAG BREAST TOMO UNI LEFT  Result Date: 01/21/2023 CLINICAL DATA:  Six-month follow-up of a probably benign left breast mass only identified on the cc view of the mammogram. EXAM: DIGITAL DIAGNOSTIC UNILATERAL LEFT MAMMOGRAM WITH TOMOSYNTHESIS TECHNIQUE: Left digital diagnostic mammography and breast tomosynthesis was performed. COMPARISON:  Previous exam(s). ACR Breast Density Category c: The breasts are heterogeneously dense, which may obscure small masses. FINDINGS: The mass in the medial left breast at a posterior depth on the CC view is stable. No other suspicious findings or changes in the left breast. IMPRESSION: Probably benign left breast mass. RECOMMENDATION: Six-month follow-up mammography of the medial left breast mass on the cc view only. The patient will be due for bilateral mammography at that time. I have discussed the findings and recommendations with the patient. If applicable, a reminder letter will be sent to the patient regarding the next appointment. BI-RADS CATEGORY  3: Probably benign. Electronically Signed   By: Gerome Sam III M.D.   On: 01/21/2023 15:38  MM DIAG BREAST TOMO BILATERAL  Result Date: 07/22/2022 CLINICAL DATA:  BI-RADS 3 follow-up of a RIGHT breast mass, initiated September 2021. EXAM: DIGITAL DIAGNOSTIC BILATERAL MAMMOGRAM WITH TOMOSYNTHESIS; ULTRASOUND RIGHT BREAST LIMITED; ULTRASOUND LEFT BREAST  LIMITED TECHNIQUE: Bilateral digital diagnostic mammography and breast tomosynthesis was performed.; Targeted ultrasound examination of the right breast was performed; Targeted ultrasound examination of the left breast was performed. COMPARISON:  Previous exam(s). ACR Breast Density Category c: The breast tissue is heterogeneously dense, which may obscure small masses. FINDINGS: Diagnostic images of the RIGHT breast demonstrate stability of oval mass in the RIGHT upper outer breast at posterior depth. No suspicious mass, distortion, or microcalcifications are identified to suggest presence of malignancy. In the LEFT lower inner breast on CC view only, there is an oval circumscribed mass with suggestion of internal fat density. It is seen on spot CC slice 48 and CC slice 56. The anterior edge of this mass is subtly present on prior mammogram from November 2022; this area is out of the field of view on prior baseline mammogram from 2021. It appears morphologically similar in comparison to prior. No additional suspicious findings are noted in the LEFT breast. Targeted ultrasound was performed of the RIGHT outer breast. At 9:30 4 cm from the nipple, there is revisualization of an oval circumscribed hypoechoic mass. It measures 11 x 5 x 10 mm, not significantly changed in comparison to priors given differences in scan plane technique. Targeted ultrasound was performed of the LEFT lower breast. A definitive sonographic correlate for the mammographically noted mass is not identified. There is a oval circumscribed isoechoic masslike area which likely reflects a prominent fat lobule noted at 7 o'clock 7 cm from the nipple. IMPRESSION: 1. There is a probably benign mass noted in the LEFT lower breast on CC view only. This is favored to be mammographically stable dating back at least 1 year and was outside of  the field of view on baseline mammogram from 2021. Option for short-term follow-up versus definitive characterization  with stereotactic biopsy was discussed with patient. She would prefer to proceed with short-term follow-up. As such recommend follow-up LEFT diagnostic mammogram in 6 months (with ultrasound if deemed necessary). This will establish 1.5 years of definitive stability. 2. Stable RIGHT breast mass for greater than 2 years, consistent with a benign etiology. No mammographic evidence of malignancy in the RIGHT breast. RECOMMENDATION: LEFT diagnostic mammogram (with LEFT breast ultrasound if deemed necessary) in 6 months. I have discussed the findings and recommendations with the patient. If applicable, a reminder letter will be sent to the patient regarding the next appointment. BI-RADS CATEGORY  3: Probably benign. Electronically Signed   By: Meda Klinefelter M.D.   On: 07/22/2022 17:00  MM DIAG BREAST TOMO BILATERAL  Result Date: 07/16/2021 CLINICAL DATA:  BI-RADS 3 follow-up of RIGHT breast mass. This was initiated September 2021. EXAM: DIGITAL DIAGNOSTIC BILATERAL MAMMOGRAM WITH TOMOSYNTHESIS AND CAD; ULTRASOUND RIGHT BREAST LIMITED TECHNIQUE: Bilateral digital diagnostic mammography and breast tomosynthesis was performed. The images were evaluated with computer-aided detection.; Targeted ultrasound examination of the right breast was performed COMPARISON:  Previous exam(s). ACR Breast Density Category c: The breast tissue is heterogeneously dense, which may obscure small masses. FINDINGS: Diagnostic images demonstrate mammographic stability of a mass in the RIGHT upper outer breast posterior depth. It is only definitively visualized on the MLO view. No suspicious mass, distortion, or microcalcifications are identified to suggest presence of malignancy in the LEFT breast. Targeted ultrasound was performed of the RIGHT upper outer breast. At 9:30 4 cm from the nipple, there is revisualization oval circumscribed hypoechoic mass. It measures 13 by 5 x 10 mm, previously 12 x 5 x 9 mm in September 2021. Differences  in measurement are due to differences in measurement technique. IMPRESSION: 1. Stable probably benign RIGHT breast mass at 9:30. Recommend follow-up mammogram and ultrasound in 1 year. This will establish 2 years of definitive stability. 2. No mammographic evidence of malignancy in LEFT breast. RECOMMENDATION: Bilateral diagnostic mammogram with RIGHT breast ultrasound in 1 year. I have discussed the findings and recommendations with the patient. If applicable, a reminder letter will be sent to the patient regarding the next appointment. BI-RADS CATEGORY  3: Probably benign. Electronically Signed   By: Meda Klinefelter M.D.   On: 07/16/2021 16:43  MM DIAG BREAST TOMO UNI RIGHT  Result Date: 12/04/2020 CLINICAL DATA:  Here for follow-up of a probably benign mass in the right breast. EXAM: DIGITAL DIAGNOSTIC UNILATERAL RIGHT MAMMOGRAM WITH TOMOSYNTHESIS AND CAD; ULTRASOUND RIGHT BREAST LIMITED TECHNIQUE: Right digital diagnostic mammography and breast tomosynthesis was performed. The images were evaluated with computer-aided detection.; Targeted ultrasound examination of the right breast was performed COMPARISON:  Previous exam(s). ACR Breast Density Category c: The breast tissue is heterogeneously dense, which may obscure small masses. FINDINGS: In the upper outer right breast on the MLO view only an oval obscured mass measuring 1.2 cm appears unchanged since 05/28/2020. Targeted right breast ultrasound was performed. At 9:30 o'clock 4 cm from the nipple an oval circumscribed hypoechoic mass measures 1.2 x 0.5 x 0.8 cm. This is not significantly changed since 06/05/2020. IMPRESSION: Circumscribed mass in the right breast is not significantly changed since 06/05/2020 and remains probably benign. RECOMMENDATION: Recommend diagnostic right breast ultrasound in 6 months to document 1 year of stability. At that time, the patient will be due for annual bilateral mammogram. I have discussed the findings  and  recommendations with the patient. If applicable, a reminder letter will be sent to the patient regarding the next appointment. BI-RADS CATEGORY  3: Probably benign. Electronically Signed   By: Romona Curls M.D.   On: 12/04/2020 16:03   MM DIAG BREAST TOMO UNI RIGHT  Result Date: 06/05/2020 CLINICAL DATA:  Screening recall for a possible right breast mass. EXAM: DIGITAL DIAGNOSTIC UNILATERAL RIGHT MAMMOGRAM WITH TOMO AND CAD; ULTRASOUND RIGHT BREAST LIMITED COMPARISON:  Previous exam(s). ACR Breast Density Category c: The breast tissue is heterogeneously dense, which may obscure small masses. FINDINGS: Spot compression tomosynthesis images through the lateral posterior right breast demonstrates an obscured oval mass measuring approximately 1.5 cm. No other suspicious masses or areas of distortion are identified. Mammographic images were processed with CAD. Ultrasound targeted to the right breast at 9:30, 4 cm from the nipple demonstrates a circumscribed oval hypoechoic mass measuring 1.2 x 0.5 x 0.9 cm. No blood flow is seen within the mass on color Doppler imaging. No other masses or suspicious areas of shadowing are identified in the lateral to upper-outer right breast. IMPRESSION: 1. There is a likely benign 1.2 cm mass in the right breast at 9:30. This is favored to represent a fibroadenoma versus a complicated cyst. RECOMMENDATION: Six-month follow-up diagnostic right breast mammogram and ultrasound. I have discussed the findings and recommendations with the patient. If applicable, a reminder letter will be sent to the patient regarding the next appointment. BI-RADS CATEGORY  3: Probably benign. Electronically Signed   By: Frederico Hamman M.D.   On: 06/05/2020 10:32   MM 3D SCREEN BREAST BILATERAL  Result Date: 05/29/2020 CLINICAL DATA:  Screening. EXAM: DIGITAL SCREENING BILATERAL MAMMOGRAM WITH TOMO AND CAD COMPARISON:  Previous exam(s). ACR Breast Density Category c: The breast tissue is  heterogeneously dense, which may obscure small masses. FINDINGS: In the right breast, a possible mass warrants further evaluation. In the left breast, no findings suspicious for malignancy. Images were processed with CAD. IMPRESSION: Further evaluation is suggested for a possible mass in the right breast. RECOMMENDATION: Diagnostic mammogram and possibly ultrasound of the right breast. (Code:[id number]) The patient will be contacted regarding the findings, and additional imaging will be scheduled. BI-RADS CATEGORY  0: Incomplete. Need additional imaging evaluation and/or prior mammograms for comparison. Electronically Signed   By: Sande Brothers M.D.   On: 05/29/2020 12:12      Pelvic/Bimanual Pap is not indicated today    Smoking History: Patient has never smoked and was not referred to quit line.    Patient Navigation: Patient education provided. Access to services provided for patient through [facility] program. No interpreter provided. No transportation provided   Colorectal Cancer Screening: Per patient has never had colonoscopy completed No complaints today. FIT test given.   Breast and Cervical Cancer Risk Assessment: Patient does not have family history of breast cancer, known genetic mutations, or radiation treatment to the chest before age 34. Patient does not have history of cervical dysplasia, immunocompromised, or DES exposure in-utero.  Risk Scores as of Encounter on 08/16/2023     Julia Gay           5-year 0.58%   Lifetime 8.04%            Last calculated by Caprice Red, CMA on 08/16/2023 at  2:33 PM      \  A: [facility] exam without pap smear No complaints with benign exam.   P: Referred patient to the [facility] for a  diagnostic mammogram. Appointment scheduled 08/16/2023.  Pascal Lux, NP 08/16/2023 2:28 PM

## 2023-08-16 NOTE — Patient Instructions (Signed)
Taught Elder Negus about self breast awareness and gave educational materials to take home. Patient did not need a Pap smear today due to last Pap smear was in 06/07/2023 per patient.  Let her know [facility] will cover Pap smears every 5 years unless has a history of abnormal Pap smears. Referred patient to the [facility] for diagnostic mammogram. Appointment scheduled for 08/16/2023. Patient aware of appointment and will be there. Let patient know will follow up with her within the next couple weeks with results. Elder Negus verbalized understanding.  Pascal Lux, NP 2:49 PM

## 2023-10-06 ENCOUNTER — Ambulatory Visit: Payer: BC Managed Care – PPO | Admitting: Dermatology

## 2024-08-02 ENCOUNTER — Other Ambulatory Visit: Payer: Self-pay

## 2024-08-02 ENCOUNTER — Telehealth: Payer: Self-pay

## 2024-08-02 DIAGNOSIS — N6310 Unspecified lump in the right breast, unspecified quadrant: Secondary | ICD-10-CM

## 2024-08-02 DIAGNOSIS — N6320 Unspecified lump in the left breast, unspecified quadrant: Secondary | ICD-10-CM

## 2024-09-21 ENCOUNTER — Other Ambulatory Visit: Payer: Self-pay | Admitting: Family Medicine

## 2024-09-21 DIAGNOSIS — N63 Unspecified lump in unspecified breast: Secondary | ICD-10-CM

## 2024-09-27 ENCOUNTER — Ambulatory Visit
Admission: RE | Admit: 2024-09-27 | Discharge: 2024-09-27 | Disposition: A | Discharge status: Home / Self Care | Source: Ambulatory Visit | Attending: Family Medicine | Admitting: Family Medicine

## 2024-09-27 ENCOUNTER — Inpatient Hospital Stay
Admission: RE | Admit: 2024-09-27 | Discharge: 2024-09-27 | Disposition: A | Source: Ambulatory Visit | Attending: Family Medicine | Admitting: Family Medicine

## 2024-09-27 DIAGNOSIS — N63 Unspecified lump in unspecified breast: Secondary | ICD-10-CM
